# Patient Record
Sex: Female | Born: 1965 | Race: White | Hispanic: No | Marital: Married | Smoking: Never smoker
Health system: Southern US, Community
[De-identification: ages and names within clinical notes are randomized; demographics above are authoritative.]

## PROBLEM LIST (undated history)

## (undated) DIAGNOSIS — I10 Essential (primary) hypertension: Secondary | ICD-10-CM

## (undated) DIAGNOSIS — E079 Disorder of thyroid, unspecified: Secondary | ICD-10-CM

## (undated) DIAGNOSIS — K2970 Gastritis, unspecified, without bleeding: Secondary | ICD-10-CM

## (undated) HISTORY — PX: TONSILLECTOMY: SUR1361

## (undated) HISTORY — DX: Disorder of thyroid, unspecified: E07.9

## (undated) HISTORY — PX: OTHER SURGICAL HISTORY: SHX169

## (undated) HISTORY — DX: Essential (primary) hypertension: I10

## (undated) HISTORY — DX: Gastritis, unspecified, without bleeding: K29.70

## (undated) HISTORY — PX: ESOPHAGOGASTRODUODENOSCOPY: SHX1529

---

## 2003-06-17 HISTORY — PX: OTHER SURGICAL HISTORY: SHX169

## 2015-08-03 HISTORY — PX: COLONOSCOPY: SHX174

## 2018-08-12 ENCOUNTER — Telehealth: Payer: Self-pay | Admitting: Family Medicine

## 2018-08-12 ENCOUNTER — Ambulatory Visit (INDEPENDENT_AMBULATORY_CARE_PROVIDER_SITE_OTHER): Payer: No Typology Code available for payment source | Admitting: Family Medicine

## 2018-08-12 ENCOUNTER — Encounter: Payer: Self-pay | Admitting: Family Medicine

## 2018-08-12 VITALS — BP 120/86 | HR 83 | Temp 97.6°F | Ht 67.0 in | Wt 213.8 lb

## 2018-08-12 DIAGNOSIS — I10 Essential (primary) hypertension: Secondary | ICD-10-CM | POA: Diagnosis not present

## 2018-08-12 DIAGNOSIS — E890 Postprocedural hypothyroidism: Secondary | ICD-10-CM

## 2018-08-12 DIAGNOSIS — Z7689 Persons encountering health services in other specified circumstances: Secondary | ICD-10-CM

## 2018-08-12 DIAGNOSIS — Z1239 Encounter for other screening for malignant neoplasm of breast: Secondary | ICD-10-CM

## 2018-08-12 DIAGNOSIS — Z85850 Personal history of malignant neoplasm of thyroid: Secondary | ICD-10-CM

## 2018-08-12 DIAGNOSIS — E039 Hypothyroidism, unspecified: Secondary | ICD-10-CM | POA: Diagnosis not present

## 2018-08-12 DIAGNOSIS — Z0000 Encounter for general adult medical examination without abnormal findings: Secondary | ICD-10-CM | POA: Diagnosis not present

## 2018-08-12 DIAGNOSIS — Z1159 Encounter for screening for other viral diseases: Secondary | ICD-10-CM | POA: Diagnosis not present

## 2018-08-12 MED ORDER — SYNTHROID 112 MCG PO TABS: 112.0000 ug | ORAL_TABLET | Freq: Every day | ORAL | 3 refills | Status: DC

## 2018-08-12 NOTE — Addendum Note (Signed)
Addended by: Diona Foley on: 08/12/2018 04:37 PM   Modules accepted: Orders

## 2018-08-12 NOTE — Progress Notes (Signed)
Katelyn Matthews is a 53 y.o. female  Chief Complaint  Patient presents with  . Establish Care    est care/ CPE- hasn't ate since 10/ thinks she had TDAP in 2015    HPI: Katelyn Matthews is a 53 y.o. female here as a new patient to establish care with our office. She moved from [place] in 01/2018. She has 2 grown sons. She is actually moving full time to [facility], [place] tomorrow but will come back to [place] as needed. She would like a CPE and fasting labs today and states she last ate around 10am today (5hrs ago).  She has had her flu vaccine. Tdap in 2015. She has a h/o thyroid cancer in 2006 - s/p total thyroidectomy   Specialists: endocrinology  Last CPE, labs: 12/2017 in [place]  Last PAP: 09/2017 - normal as per pt; remote h/o abnormal PAP in her 20's Last mammo: 09/2017 - family h/o breast cancer in mother Last colonoscopy: 2017 - normal as per pt and d/t f/u in 10 years   Med refills needed today: n/a   Past Medical History:  Diagnosis Date  . Thyroid disease    2006 carcinoma surgery     Past Surgical History:  Procedure Laterality Date  . carcinoma removal  2005  . ganglion cyst removed    . TONSILLECTOMY      Social History   Socioeconomic History  . Marital status: Married    Spouse name: Not on file  . Number of children: Not on file  . Years of education: Not on file  . Highest education Matthews: Not on file  Occupational History  . Not on file  Social Needs  . Financial resource strain: Not on file  . Food insecurity:    Worry: Not on file    Inability: Not on file  . Transportation needs:    Medical: Not on file    Non-medical: Not on file  Tobacco Use  . Smoking status: Never Smoker  . Smokeless tobacco: Never Used  Substance and Sexual Activity  . Alcohol use: Yes    Comment: socially  . Drug use: Never  . Sexual activity: Not on file  Lifestyle  . Physical activity:    Days per week: Not on file    Minutes per session: Not on file  .  Stress: Not on file  Relationships  . Social connections:    Talks on phone: Not on file    Gets together: Not on file    Attends religious service: Not on file    Active member of club or organization: Not on file    Attends meetings of clubs or organizations: Not on file    Relationship status: Not on file  . Intimate partner violence:    Fear of current or ex partner: Not on file    Emotionally abused: Not on file    Physically abused: Not on file    Forced sexual activity: Not on file  Other Topics Concern  . Not on file  Social History Narrative  . Not on file    Family History  Adopted: Yes  Problem Relation Age of Onset  . Breast cancer Mother      Immunization History  Administered Date(s) Administered  . Influenza-Unspecified 03/16/2018    Outpatient Encounter Medications as of 08/12/2018  Medication Sig  . lisinopril (PRINIVIL,ZESTRIL) 10 MG tablet Take 10 mg by mouth daily.  . [DISCONTINUED] citalopram (CELEXA) 10 MG tablet  Take 10 mg by mouth daily.  . [DISCONTINUED] levothyroxine (SYNTHROID, LEVOTHROID) 112 MCG tablet Take 112 mcg by mouth daily before breakfast.  . SYNTHROID 112 MCG tablet Take 1 tablet (112 mcg total) by mouth daily before breakfast.   No facility-administered encounter medications on file as of 08/12/2018.      ROS: Gen: no fever, chills  Skin: no rash, itching ENT: no ear pain, ear drainage, nasal congestion, rhinorrhea, sinus pressure, sore throat Eyes: no blurry vision, double vision Resp: no cough, wheeze,SOB CV: no CP, palpitations, LE edema,  GI: no heartburn, n/v/d/c, abd pain GU: no dysuria, urgency, frequency, hematuria  MSK: no joint pain, myalgias, back pain Neuro: no dizziness, headache, weakness, vertigo Psych: no depression, anxiety, insomnia   No Known Allergies  BP 120/86   Pulse 83   Temp 97.6 F (36.4 C) (Oral)   Ht 5\' 7"  (1.702 m)   Wt 213 lb 12.8 oz (97 kg)   SpO2 95%   BMI 33.49 kg/m   Physical  Exam  Constitutional: She is oriented to person, place, and time. She appears well-developed and well-nourished. No distress.  HENT:  Head: Normocephalic and atraumatic.  Right Ear: Tympanic membrane and ear canal normal.  Left Ear: Tympanic membrane and ear canal normal.  Nose: Nose normal.  Mouth/Throat: Oropharynx is clear and moist and mucous membranes are normal.  Neck: Neck supple.  Cardiovascular: Normal rate, regular rhythm and normal heart sounds.  No murmur heard. Pulmonary/Chest: Effort normal and breath sounds normal. No respiratory distress. She has no wheezes. She has no rhonchi.  Abdominal: Soft. Bowel sounds are normal. She exhibits no distension and no mass. There is no abdominal tenderness.  Musculoskeletal: Normal range of motion.        General: No edema.  Lymphadenopathy:    She has no cervical adenopathy.  Neurological: She is alert and oriented to person, place, and time.  Skin: Skin is warm and dry.  Psychiatric: She has a normal mood and affect. Her behavior is normal.     A/P:  1. Encounter to establish care with new doctor  2. Annual physical exam - immunizations UTD - UTD on PAP, mammo, colonoscopy. Due for mammo in 09/2018 so referral placed today - discussed importance of regular CV exercise, healthy diet, adequate sleep - ALT - AST - Basic metabolic panel - Lipid panel - HIV Antibody (routine testing w rflx) - Hepatitis C antibody - VITAMIN D 25 Hydroxy (Vit-D Deficiency, Fractures) - next CPE in 1 year  3. Need for hepatitis C screening test - Hepatitis C antibody  4. Hypothyroidism, unspecified type - T4, free - TSH Cont: - SYNTHROID 112 MCG tablet; Take 1 tablet (112 mcg total) by mouth daily before breakfast.  Dispense: 90 tablet; Refill: 3  5. Essential hypertension - controlled, at goal - cont lisinopril 10mg  daily - Basic metabolic panel  6. Screening for breast cancer - MM DIGITAL SCREENING BILATERAL; Future

## 2018-08-12 NOTE — Telephone Encounter (Signed)
Dr.C please advise

## 2018-08-12 NOTE — Telephone Encounter (Signed)
Request for add on lab test submitted

## 2018-08-12 NOTE — Patient Instructions (Signed)

## 2018-08-12 NOTE — Telephone Encounter (Signed)
Copied from [place] [id number]. Topic: Quick Communication - See Telephone Encounter >> Aug 12, 2018  4:23 PM Blase Mess A wrote: CRM for notification. See Telephone encounter for: 08/12/18. Patient is calling and wants to be sure the Thyroid Goblin test is done. Please advise [phone]

## 2018-08-12 NOTE — Telephone Encounter (Signed)
Pt aware that the test was added on.

## 2018-08-13 ENCOUNTER — Encounter: Payer: Self-pay | Admitting: Family Medicine

## 2018-08-13 DIAGNOSIS — E7800 Pure hypercholesterolemia, unspecified: Secondary | ICD-10-CM | POA: Insufficient documentation

## 2018-08-13 DIAGNOSIS — E559 Vitamin D deficiency, unspecified: Secondary | ICD-10-CM | POA: Insufficient documentation

## 2018-08-13 LAB — ALT: ALT: 27 U/L (ref 0–35)

## 2018-08-13 LAB — LIPID PANEL
Cholesterol: 241 mg/dL — ABNORMAL HIGH (ref 0–200)
HDL: 39.3 mg/dL (ref >39.00)
LDL Cholesterol: 174 mg/dL — ABNORMAL HIGH (ref 0–99)
NonHDL: 201.34
Total CHOL/HDL Ratio: 6
Triglycerides: 138 mg/dL (ref 0.0–149.0)
VLDL: 27.6 mg/dL (ref 0.0–40.0)

## 2018-08-13 LAB — BASIC METABOLIC PANEL
BUN: 14 mg/dL (ref 6–23)
CO2: 25 meq/L (ref 19–32)
Calcium: 9.3 mg/dL (ref 8.4–10.5)
Chloride: 105 mEq/L (ref 96–112)
Creatinine, Ser: 0.78 mg/dL (ref 0.40–1.20)
GFR: 76.68 mL/min (ref >60.00)
Glucose, Bld: 85 mg/dL (ref 70–99)
Potassium: 3.9 mEq/L (ref 3.5–5.1)
Sodium: 140 mEq/L (ref 135–145)

## 2018-08-13 LAB — T4, FREE: Free T4: 1.17 ng/dL (ref 0.60–1.60)

## 2018-08-13 LAB — HEPATITIS C ANTIBODY
Hepatitis C Ab: NONREACTIVE
SIGNAL TO CUT-OFF: 0.02 (ref <1.00)

## 2018-08-13 LAB — TSH: TSH: 0.94 u[IU]/mL (ref 0.35–4.50)

## 2018-08-13 LAB — AST: AST: 22 U/L (ref 0–37)

## 2018-08-13 LAB — HIV ANTIBODY (ROUTINE TESTING W REFLEX): HIV 1&2 Ab, 4th Generation: NONREACTIVE

## 2018-08-13 LAB — VITAMIN D 25 HYDROXY (VIT D DEFICIENCY, FRACTURES): VITD: 29.75 ng/mL — ABNORMAL LOW (ref 30.00–100.00)

## 2018-08-13 LAB — THYROGLOBULIN ANTIBODY: Thyroglobulin Ab: <1 IU/mL (ref <=1)

## 2018-09-13 ENCOUNTER — Ambulatory Visit: Payer: No Typology Code available for payment source

## 2018-10-27 ENCOUNTER — Encounter: Payer: Self-pay | Admitting: Family Medicine

## 2018-10-27 DIAGNOSIS — I10 Essential (primary) hypertension: Secondary | ICD-10-CM

## 2018-10-27 MED ORDER — LISINOPRIL 10 MG PO TABS: 10.0000 mg | ORAL_TABLET | Freq: Every day | ORAL | 3 refills | Status: DC

## 2018-11-04 ENCOUNTER — Encounter: Payer: Self-pay | Admitting: Family Medicine

## 2018-11-05 ENCOUNTER — Encounter: Payer: Self-pay | Admitting: Family Medicine

## 2018-11-05 ENCOUNTER — Telehealth (INDEPENDENT_AMBULATORY_CARE_PROVIDER_SITE_OTHER): Payer: No Typology Code available for payment source | Admitting: Family Medicine

## 2018-11-05 VITALS — Ht 67.0 in | Wt 217.0 lb

## 2018-11-05 DIAGNOSIS — I10 Essential (primary) hypertension: Secondary | ICD-10-CM

## 2018-11-05 DIAGNOSIS — Z6833 Body mass index (BMI) 33.0-33.9, adult: Secondary | ICD-10-CM | POA: Diagnosis not present

## 2018-11-05 MED ORDER — PHENTERMINE HCL 37.5 MG PO CAPS: 37.5000 mg | ORAL_CAPSULE | ORAL | 0 refills | Status: DC

## 2018-11-05 NOTE — Progress Notes (Signed)
Virtual Visit via Video Note  I connected with Katelyn Matthews on 11/05/18 at  8:30 AM EDT by a video enabled telemedicine application and verified that I am speaking with the correct person using two identifiers. Location patient: home Location provider: work Persons participating in the virtual visit: patient, provider  I discussed the limitations of evaluation and management by telemedicine and the availability of in person appointments. The patient expressed understanding and agreed to proceed.  Chief Complaint  Patient presents with  . Weight Gain    pt wants weightloss med consults (phentermine) pt weight 217lb     HPI: Katelyn Matthews is a 53 y.o. female who would like to discuss weight loss and specifically Rx for phentermine.  Pt is riding her bike 10 miles per day and takes dog for a walk daily. She states she was eating more in early march right after moving to [place], but she has worked hard to improve her diet in the past 4-6 weeks. Despite her increased exercise and dietary improvements, pt states she has not lost weight.  She states she was on phentermine in the past, last time a little more than 1 year ago. She did not have any side effects and felt it was very helpful at "jumpstarting" her weight loss.  She does have a h/o HTN and is on lisinopril 10mg  daily.  Past Medical History:  Diagnosis Date  . Thyroid disease    2006 carcinoma surgery     Past Surgical History:  Procedure Laterality Date  . carcinoma removal  2005  . ganglion cyst removed    . TONSILLECTOMY      Family History  Adopted: Yes  Problem Relation Age of Onset  . Breast cancer Mother     Social History   Tobacco Use  . Smoking status: Never Smoker  . Smokeless tobacco: Never Used  Substance Use Topics  . Alcohol use: Yes    Comment: socially  . Drug use: Never     Current Outpatient Medications:  .  lisinopril (ZESTRIL) 10 MG tablet, Take 1 tablet (10 mg total) by mouth  daily., Disp: 90 tablet, Rfl: 3 .  SYNTHROID 112 MCG tablet, Take 1 tablet (112 mcg total) by mouth daily before breakfast., Disp: 90 tablet, Rfl: 3  No Known Allergies    ROS: See pertinent positives and negatives per HPI.   EXAM:  VITALS per patient if applicable: Wt 217 lb (98.4 kg) Comment: pt reported  BMI 33.99 kg/m   BP Readings from Last 3 Encounters:  08/12/18 120/86   Pulse Readings from Last 3 Encounters:  08/12/18 83   Wt Readings from Last 3 Encounters:  11/05/18 217 lb (98.4 kg)  08/12/18 213 lb 12.8 oz (97 kg)    GENERAL: alert, oriented, appears well and in no acute distress  NECK: normal movements of the head and neck  LUNGS: on inspection no signs of respiratory distress, breathing rate appears normal, no obvious gross SOB, gasping or wheezing, no conversational dyspnea  CV: no obvious cyanosis  MS: moves all visible extremities without noticeable abnormality  PSYCH/NEURO: pleasant and cooperative, speech and thought processing grossly intact   ASSESSMENT AND PLAN:   1. Adult BMI 33.0-33.9 kg/sq m - pt is biking 10 miles per day 5-7 days per week and is working to improve diet. I congratulated pt on this and encouraged her to continue this excellent lifestyle modifications Rx: - phentermine 37.5 MG capsule; Take 1 capsule (37.5 mg  total) by mouth every morning.  Dispense: 30 capsule; Refill: 0 - pt will purchase BP cuff and check BP 2-3x/wk x 3-4 wks and keep log of BP readings - f/u in 4 wks (virtual visit)  2. Essential hypertension - well-controlled on lisinopril 10mg  daily - pt will purchase BP cuff and check BP 2-3x/wk x 3-4 wks and keep log of BP readings to ensure phentermine does not cause BP elevations - cont lisinopril - f/u in 4wks    I discussed the assessment and treatment plan with the patient. The patient was provided an opportunity to ask questions and all were answered. The patient agreed with the plan and demonstrated an  understanding of the instructions.   The patient was advised to call back or seek an in-person evaluation if the symptoms worsen or if the condition fails to improve as anticipated.   Letta Median, DO

## 2018-11-15 ENCOUNTER — Encounter: Payer: Self-pay | Admitting: Family Medicine

## 2018-11-16 ENCOUNTER — Encounter: Payer: Self-pay | Admitting: Family Medicine

## 2018-11-22 ENCOUNTER — Encounter: Payer: Self-pay | Admitting: Family Medicine

## 2018-11-28 ENCOUNTER — Encounter: Payer: Self-pay | Admitting: Family Medicine

## 2018-12-06 ENCOUNTER — Encounter: Payer: Self-pay | Admitting: Family Medicine

## 2018-12-08 ENCOUNTER — Encounter: Payer: Self-pay | Admitting: Family Medicine

## 2018-12-08 DIAGNOSIS — Z6833 Body mass index (BMI) 33.0-33.9, adult: Secondary | ICD-10-CM

## 2018-12-08 MED ORDER — PHENTERMINE HCL 37.5 MG PO CAPS: 37.5000 mg | ORAL_CAPSULE | ORAL | 0 refills | Status: DC

## 2018-12-10 ENCOUNTER — Telehealth: Payer: Self-pay | Admitting: Family Medicine

## 2018-12-10 DIAGNOSIS — Z6833 Body mass index (BMI) 33.0-33.9, adult: Secondary | ICD-10-CM

## 2018-12-10 MED ORDER — PHENTERMINE HCL 37.5 MG PO CAPS: 37.5000 mg | ORAL_CAPSULE | ORAL | 0 refills | Status: DC

## 2018-12-10 NOTE — Telephone Encounter (Signed)
Dr.C please advise

## 2018-12-10 NOTE — Telephone Encounter (Signed)
Sent pt mychart message

## 2018-12-10 NOTE — Telephone Encounter (Signed)
Medication: phentermine 37.5 MG capsule [[id number]] - Medication was sent to wrong pharmacy can it be sent to pharmacy below  Has the patient contacted their pharmacy? Yes  (Agent: If no, request that the patient contact the pharmacy for the refill.) (Agent: If yes, when and what did the pharmacy advise?)  Preferred Pharmacy (with phone number or street name): [facility], [place] [phone] (Phone) [phone] (Fax)    Agent: Please be advised that RX refills may take up to 3 business days. We ask that you follow-up with your pharmacy.

## 2018-12-10 NOTE — Telephone Encounter (Signed)
Rx sent to correct pharm

## 2018-12-13 ENCOUNTER — Encounter: Payer: Self-pay | Admitting: Family Medicine

## 2018-12-20 ENCOUNTER — Encounter: Payer: Self-pay | Admitting: Family Medicine

## 2018-12-29 ENCOUNTER — Encounter: Payer: Self-pay | Admitting: Family Medicine

## 2019-01-03 ENCOUNTER — Encounter: Payer: Self-pay | Admitting: Family Medicine

## 2019-01-13 ENCOUNTER — Encounter: Payer: Self-pay | Admitting: Family Medicine

## 2019-01-13 DIAGNOSIS — Z6833 Body mass index (BMI) 33.0-33.9, adult: Secondary | ICD-10-CM

## 2019-01-13 MED ORDER — PHENTERMINE HCL 37.5 MG PO CAPS: 37.5000 mg | ORAL_CAPSULE | ORAL | 0 refills | Status: DC

## 2019-02-14 ENCOUNTER — Encounter: Payer: Self-pay | Admitting: Family Medicine

## 2019-02-14 DIAGNOSIS — Z6833 Body mass index (BMI) 33.0-33.9, adult: Secondary | ICD-10-CM

## 2019-02-15 MED ORDER — PHENTERMINE HCL 37.5 MG PO CAPS: 37.5000 mg | ORAL_CAPSULE | ORAL | 0 refills | Status: DC

## 2019-05-13 ENCOUNTER — Encounter: Payer: Self-pay | Admitting: Family Medicine

## 2019-05-18 ENCOUNTER — Telehealth (INDEPENDENT_AMBULATORY_CARE_PROVIDER_SITE_OTHER): Payer: No Typology Code available for payment source | Admitting: Family Medicine

## 2019-05-18 ENCOUNTER — Encounter: Payer: Self-pay | Admitting: Family Medicine

## 2019-05-18 VITALS — Ht 67.0 in | Wt 199.0 lb

## 2019-05-18 DIAGNOSIS — R1013 Epigastric pain: Secondary | ICD-10-CM

## 2019-05-18 MED ORDER — ESOMEPRAZOLE MAGNESIUM 40 MG PO PACK: 40.0000 mg | PACK | Freq: Every day | ORAL | 1 refills | Status: DC

## 2019-05-18 MED ORDER — ESOMEPRAZOLE MAGNESIUM 40 MG PO CPDR: 40.0000 mg | DELAYED_RELEASE_CAPSULE | Freq: Every day | ORAL | 3 refills | Status: DC

## 2019-05-18 NOTE — Progress Notes (Signed)
Virtual Visit via Video Note  I connected with Katelyn Matthews on 05/18/19 at  4:00 PM EST by a video enabled telemedicine application and verified that I am speaking with the correct person using two identifiers. Location patient: home Location provider: work Persons participating in the virtual visit: patient, provider  I discussed the limitations of evaluation and management by telemedicine and the availability of in person appointments. The patient expressed understanding and agreed to proceed.  Chief Complaint  Patient presents with  . Abdominal Pain    sx have improved some today, took some nexium, Denies nausea,vomitting diarrhea      HPI: Katelyn Matthews is a 53 y.o. female complains of 1 mo h/o "mild" epigastric pain. She has felt better the past few days.  Pt has been taking nexium 20mg  daily x 1 week with good relief.  She denies n/v/d/c. No blood in stool.  No association with food or type of food. Her diet and volume has not changed. Normal appetite. Pt notes a h/o "gastritis" about 20 years ago.  Denies fever, chills.    Past Medical History:  Diagnosis Date  . Thyroid disease    2006 carcinoma surgery     Past Surgical History:  Procedure Laterality Date  . carcinoma removal  2005  . ganglion cyst removed    . TONSILLECTOMY      Family History  Adopted: Yes  Problem Relation Age of Onset  . Breast cancer Mother     Social History   Tobacco Use  . Smoking status: Never Smoker  . Smokeless tobacco: Never Used  Substance Use Topics  . Alcohol use: Yes    Comment: socially  . Drug use: Never     Current Outpatient Medications:  .  Esomeprazole Magnesium (NEXIUM PO), Take by mouth., Disp: , Rfl:  .  lisinopril (ZESTRIL) 10 MG tablet, Take 1 tablet (10 mg total) by mouth daily., Disp: 90 tablet, Rfl: 3 .  SYNTHROID 112 MCG tablet, Take 1 tablet (112 mcg total) by mouth daily before breakfast., Disp: 90 tablet, Rfl: 3 .  phentermine 37.5 MG  capsule, Take 1 capsule (37.5 mg total) by mouth every morning. (Patient not taking: Reported on 05/18/2019), Disp: 30 capsule, Rfl: 0  No Known Allergies    ROS: See pertinent positives and negatives per HPI.   EXAM:  VITALS per patient if applicable: Ht 5\' 7"  (1.702 m)   Wt 199 lb (90.3 kg)   BMI 31.17 kg/m    GENERAL: alert, oriented, appears well and in no acute distress  HEENT: atraumatic, conjunctiva clear, no obvious abnormalities on inspection of external nose and ears  NECK: normal movements of the head and neck  LUNGS: on inspection no signs of respiratory distress, breathing rate appears normal, no obvious gross SOB, gasping or wheezing, no conversational dyspnea  CV: no obvious cyanosis  MS: moves all visible extremities without noticeable abnormality  PSYCH/NEURO: pleasant and cooperative, no obvious depression or anxiety, speech and thought processing grossly intact   ASSESSMENT AND PLAN: 1. Epigastric abdominal pain - symptoms x 1 mo, improved in past few days with use of nexium daily x 1 week Rx: - esomeprazole (NEXIUM) 40 MG capsule; Take 1 capsule (40 mg total) by mouth daily at 12 noon.  Dispense: 90 capsule; Refill: 3 - pt has f/u appt in 2 wks   I discussed the assessment and treatment plan with the patient. The patient was provided an opportunity to ask questions and all  were answered. The patient agreed with the plan and demonstrated an understanding of the instructions.   The patient was advised to call back or seek an in-person evaluation if the symptoms worsen or if the condition fails to improve as anticipated.   Letta Median, DO

## 2019-05-29 ENCOUNTER — Encounter: Payer: Self-pay | Admitting: Family Medicine

## 2019-05-31 ENCOUNTER — Encounter: Payer: Self-pay | Admitting: Family Medicine

## 2019-06-01 ENCOUNTER — Encounter: Payer: Self-pay | Admitting: Gastroenterology

## 2019-06-01 ENCOUNTER — Encounter: Payer: Self-pay | Admitting: Family Medicine

## 2019-06-01 ENCOUNTER — Telehealth (INDEPENDENT_AMBULATORY_CARE_PROVIDER_SITE_OTHER): Payer: No Typology Code available for payment source | Admitting: Family Medicine

## 2019-06-01 VITALS — Ht 67.0 in | Wt 200.0 lb

## 2019-06-01 DIAGNOSIS — R1013 Epigastric pain: Secondary | ICD-10-CM

## 2019-06-01 MED ORDER — SUCRALFATE 1 G PO TABS: 1.0000 g | ORAL_TABLET | Freq: Three times a day (TID) | ORAL | 1 refills | Status: DC

## 2019-06-01 NOTE — Progress Notes (Signed)
Virtual Visit via Video Note  I connected with Katelyn Matthews on 06/01/19 at  9:30 AM EST by a video enabled telemedicine application and verified that I am speaking with the correct person using two identifiers. Location patient: home Location provider: work  Persons participating in the virtual visit: patient, provider  I discussed the limitations of evaluation and management by telemedicine and the availability of in person appointments. The patient expressed understanding and agreed to proceed.  Chief Complaint  Patient presents with  . Bloated    pt has tried nexium, lots of discomfort      HPI: Katelyn Matthews is a 53 y.o. female to f/u on GI symptoms. She has been on nexium 40mg  daily x 2+ wks, and was on 20mg  daily x 1-2wks prior to that. Since last VV with me on 05/18/19, she was seen in the ER at [facility] in [place], [place]. Labs, CXR, abdominal xray - WNL. Symptoms did improve somewhat with GI cocktail.  She complains of abdominal distention/bloating that is some what intermittent. She still has epigastric abdominal discomfort/pain. She also notes pain in the Rt side of her back.  She went out to dinner last night and symptoms of bloating improved with dinner and a few beers.  She has cut out coffee since ER visit.  No unintentional weight loss, fever, chills, night sweats, fatigue. Appetite is good. No early satiety. No change in bowel habits. No melena.  Pt is anxious and worried she has pancreatic cancer.   Past Medical History:  Diagnosis Date  . Thyroid disease    2006 carcinoma surgery     Past Surgical History:  Procedure Laterality Date  . carcinoma removal  2005  . ganglion cyst removed    . TONSILLECTOMY      Family History  Adopted: Yes  Problem Relation Age of Onset  . Breast cancer Mother     Social History   Tobacco Use  . Smoking status: Never Smoker  . Smokeless tobacco: Never Used  Substance Use Topics  . Alcohol use: Yes     Comment: socially  . Drug use: Never     Current Outpatient Medications:  .  esomeprazole (NEXIUM) 40 MG capsule, Take 1 capsule (40 mg total) by mouth daily at 12 noon., Disp: 90 capsule, Rfl: 3 .  lisinopril (ZESTRIL) 10 MG tablet, Take 1 tablet (10 mg total) by mouth daily., Disp: 90 tablet, Rfl: 3 .  SYNTHROID 112 MCG tablet, Take 1 tablet (112 mcg total) by mouth daily before breakfast., Disp: 90 tablet, Rfl: 3  No Known Allergies    ROS: See pertinent positives and negatives per HPI.   EXAM:  VITALS per patient if applicable: Ht 5\' 7"  (1.702 m)   Wt 200 lb (90.7 kg)   BMI 31.32 kg/m    GENERAL: alert, oriented, appears well and in no acute distress  HEENT: atraumatic, conjunctiva clear, no obvious abnormalities on inspection of external nose and ears  NECK: normal movements of the head and neck  LUNGS: on inspection no signs of respiratory distress, breathing rate appears normal, no obvious gross SOB, gasping or wheezing, no conversational dyspnea  CV: no obvious cyanosis  MS: moves all visible extremities without noticeable abnormality  PSYCH/NEURO: pleasant and cooperative, speech and thought processing grossly intact   ASSESSMENT AND PLAN: 1. Epigastric abdominal pain Rx: - sucralfate (CARAFATE) 1 g tablet; Take 1 tablet (1 g total) by mouth 4 (four) times daily -  with  meals and at bedtime.  Dispense: 120 tablet; Refill: 1 - Ambulatory referral to Gastroenterology - US Abdomen Limited RUQ; Future - normal CBC, CMP, amylase, lipase, abd xray, cxr from external ER on 05/20/19    I discussed the assessment and treatment plan with the patient. The patient was provided an opportunity to ask questions and all were answered. The patient agreed with the plan and demonstrated an understanding of the instructions.   The patient was advised to call back or seek an in-person evaluation if the symptoms worsen or if the condition fails to improve as  anticipated.   Letta Median, DO

## 2019-06-15 ENCOUNTER — Encounter: Payer: Self-pay | Admitting: Family Medicine

## 2019-06-15 ENCOUNTER — Ambulatory Visit
Admission: RE | Admit: 2019-06-15 | Discharge: 2019-06-15 | Disposition: A | Discharge status: Home / Self Care | Payer: No Typology Code available for payment source | Source: Ambulatory Visit | Attending: Family Medicine | Admitting: Family Medicine

## 2019-06-15 DIAGNOSIS — R1013 Epigastric pain: Secondary | ICD-10-CM

## 2019-07-01 ENCOUNTER — Ambulatory Visit (INDEPENDENT_AMBULATORY_CARE_PROVIDER_SITE_OTHER): Payer: No Typology Code available for payment source | Admitting: Gastroenterology

## 2019-07-01 ENCOUNTER — Encounter: Payer: Self-pay | Admitting: Gastroenterology

## 2019-07-01 ENCOUNTER — Other Ambulatory Visit: Payer: Self-pay

## 2019-07-01 VITALS — BP 132/84 | HR 77 | Temp 98.1°F | Ht 67.0 in | Wt 207.5 lb

## 2019-07-01 DIAGNOSIS — R1013 Epigastric pain: Secondary | ICD-10-CM

## 2019-07-01 DIAGNOSIS — R1310 Dysphagia, unspecified: Secondary | ICD-10-CM | POA: Diagnosis not present

## 2019-07-01 DIAGNOSIS — R1012 Left upper quadrant pain: Secondary | ICD-10-CM

## 2019-07-01 DIAGNOSIS — R140 Abdominal distension (gaseous): Secondary | ICD-10-CM

## 2019-07-01 NOTE — Progress Notes (Signed)
Chief Complaint: Epigastric pain, LUQ pain, back pain  Referring Provider:     Ronnald Nian, DO   HPI:    Katelyn Matthews is a 54 y.o. female referred to the Gastroenterology Clinic for evaluation of abdominal pain.  She would also like to discuss questions about prolonged use of Nexium.  Has been having intermittent epigastric/LUQ and left upper back pain, along with intermittent bloating and abdominal distension. Sxs started end of Oct 2020. No improvement with trial of Nexium, so Carafate was added last month.  Has had some improvement with the Carafate.  Has not identified specific triggers.  Had eliminated coffee (but does ok with cold brew coffee) with improvement. Possibly triggered with soda, fried foods.   Patient is otherwise without preceding exposures to include recent antibiotics, hospitalization, sick contacts, travel and denies new medications, supplements, OTCs.  Separately, she c/o rare episodes of pill dysphagia more recently.  Otherwise, no dysphagia with meals.  No food impactions.  No reflux symptoms.    Evaluation to date: -05/20/2019: ER at [facility] in [place], [place]. Labs (CBC, CMP, amylase, lipase), CXR, abdominal xray - WNL. Symptoms did improve somewhat with GI cocktail. -06/15/2019: Normal abdominal ultrasound  No known family history of CRC, GI malignancy, liver disease, pancreatic disease, or IBD.   Endoscopic Hx: - Colonoscopy (2017, [place]): Normal per patient with 10 year recall. Requested records today - EGD approx 20+ years ago for stomach issues- gastritis per patient  Past Medical History:  Diagnosis Date  . Gastritis   . Thyroid disease    2006 carcinoma surgery      Past Surgical History:  Procedure Laterality Date  . carcinoma removal  2005  . COLONOSCOPY  2017   In [place]. Said things were fine   . ESOPHAGOGASTRODUODENOSCOPY     over 20 years ago. Said she was dx with gastritis  . ganglion cyst  removed    . TONSILLECTOMY     Family History  Adopted: Yes  Problem Relation Age of Onset  . Breast cancer Mother    Social History   Tobacco Use  . Smoking status: Never Smoker  . Smokeless tobacco: Never Used  . Tobacco comment: said that she socially had a puff when she was younger  Substance Use Topics  . Alcohol use: Yes    Comment: socially  . Drug use: Never   Current Outpatient Medications  Medication Sig Dispense Refill  . lisinopril (ZESTRIL) 10 MG tablet Take 1 tablet (10 mg total) by mouth daily. 90 tablet 3  . sucralfate (CARAFATE) 1 g tablet Take 1 tablet (1 g total) by mouth 4 (four) times daily -  with meals and at bedtime. 120 tablet 1  . SYNTHROID 112 MCG tablet Take 1 tablet (112 mcg total) by mouth daily before breakfast. 90 tablet 3  . esomeprazole (NEXIUM) 40 MG capsule Take 1 capsule (40 mg total) by mouth daily at 12 noon. (Patient not taking: Reported on 07/01/2019) 90 capsule 3   No current facility-administered medications for this visit.   No Known Allergies   Review of Systems: All systems reviewed and negative except where noted in HPI.     Physical Exam:    Wt Readings from Last 3 Encounters:  07/01/19 207 lb 8 oz (94.1 kg)  06/01/19 200 lb (90.7 kg)  05/18/19 199 lb (90.3 kg)    BP 132/84   Pulse  77   Temp 98.1 F (36.7 C)   Ht 5\' 7"  (1.702 m)   Wt 207 lb 8 oz (94.1 kg)   BMI 32.50 kg/m  Constitutional:  Pleasant, in no acute distress. Psychiatric: Normal mood and affect. Behavior is normal. EENT: Pupils normal.  Conjunctivae are normal. No scleral icterus. Neck supple. No cervical LAD. Cardiovascular: Normal rate, regular rhythm. No edema Pulmonary/chest: Effort normal and breath sounds normal. No wheezing, rales or rhonchi. Abdominal: Minimal TTP in epigastrium, without rebound or guarding.  Soft, nondistended. Bowel sounds active throughout. There are no masses palpable. No hepatomegaly. Neurological: Alert and oriented to  person place and time. Skin: Skin is warm and dry. No rashes noted.   ASSESSMENT AND PLAN;   1) Epigastric pain 2) LUQ pain 3) Left upper back pain 4) Bloating 5) Abdominal distention Discussed the broad DDx for her presenting symptoms and will evaluate as below: -EGD to evaluate for PUD, gastritis, small bowel pathology, with gastric/duodenal biopsies -Trial of probiotics -Recommended low FODMAP diet.  Provided with handout today along with instruction regarding details of the diet -Diet diary -Resume Carafate for now given clinical improvement  6) Dysphagia -Vague c/o intermittent pill dysphagia.  Unsure if she is really describing pill esophagitis, as it tends to occur with qhs/nighttime medications.  Evaluate for stricture, ring, etc. time of EGD as above, with dilation as appropriate  The indications, risks, and benefits of EGD were explained to the patient in detail. Risks include but are not limited to bleeding, perforation, adverse reaction to medications, and cardiopulmonary compromise. Sequelae include but are not limited to the possibility of surgery, hositalization, and mortality. The patient verbalized understanding and wished to proceed. All questions answered, referred to scheduler. Further recommendations pending results of the exam.    Lavena Bullion, DO, FACG  07/01/2019, 9:46 AM   Ronnald Nian, DO

## 2019-07-01 NOTE — Patient Instructions (Signed)
Low FODMAP Diet: (Fermentable Oligosaccharides, Disaccharides, Monosaccharides, and Polyols) These are short chain carbohydrates and sugar alcohols that are poorly absorbed by the body, resulting in multiple abdominal symptoms, including changes in bowel habits, abdominal pain/discomfort, bloating, abdominal distension, gas, etc.       Please start taking a Probiotic daily Try Align. This is an over the medication  It has been recommended to you by your physician that you have a(n) endoscopy completed. Per your request, we did not schedule the procedure(s) today. Please contact our office at [phone] should you decide to have the procedure completed. You will be scheduled for a pre-visit and procedure at that time.  It was a pleasure to see you today!  Vito Cirigliano, D.O.

## 2019-07-05 ENCOUNTER — Other Ambulatory Visit: Payer: Self-pay | Admitting: Gastroenterology

## 2019-07-05 ENCOUNTER — Telehealth: Payer: Self-pay | Admitting: Gastroenterology

## 2019-07-05 DIAGNOSIS — R1310 Dysphagia, unspecified: Secondary | ICD-10-CM

## 2019-07-05 DIAGNOSIS — Z01818 Encounter for other preprocedural examination: Secondary | ICD-10-CM

## 2019-07-05 DIAGNOSIS — R140 Abdominal distension (gaseous): Secondary | ICD-10-CM

## 2019-07-05 DIAGNOSIS — R1013 Epigastric pain: Secondary | ICD-10-CM

## 2019-07-05 NOTE — Telephone Encounter (Signed)
Spoke to patient. She is scheduled for EGD on 07/26/19 at 830 AM

## 2019-07-05 NOTE — Telephone Encounter (Signed)
Please call pt to help her schedule her EGD. Pt became very frustrated when I tried to schedule her for her appts. She stated that nobody told her that she had to come see the nurse two weeks before her procedure. She stated that she leaves 3 hours away and cannot come on 3 different days, to see the nurse, for her Covid test and then for the procedure. She stated that we are making things too complicated. Pls call her.

## 2019-07-12 ENCOUNTER — Encounter: Payer: Self-pay | Admitting: Gastroenterology

## 2019-07-13 ENCOUNTER — Encounter: Payer: Self-pay | Admitting: Family Medicine

## 2019-07-13 DIAGNOSIS — R1013 Epigastric pain: Secondary | ICD-10-CM

## 2019-07-15 NOTE — Telephone Encounter (Signed)
Can we find out where the referral went to?  Please advise.

## 2019-07-21 ENCOUNTER — Other Ambulatory Visit: Payer: Self-pay | Admitting: Gastroenterology

## 2019-07-21 ENCOUNTER — Ambulatory Visit (INDEPENDENT_AMBULATORY_CARE_PROVIDER_SITE_OTHER): Payer: 59

## 2019-07-21 ENCOUNTER — Other Ambulatory Visit: Payer: Self-pay

## 2019-07-21 DIAGNOSIS — Z1159 Encounter for screening for other viral diseases: Secondary | ICD-10-CM

## 2019-07-22 ENCOUNTER — Other Ambulatory Visit: Payer: Self-pay

## 2019-07-22 ENCOUNTER — Ambulatory Visit
Admission: RE | Admit: 2019-07-22 | Discharge: 2019-07-22 | Disposition: A | Discharge status: Home / Self Care | Payer: 59 | Source: Ambulatory Visit | Attending: Family Medicine | Admitting: Family Medicine

## 2019-07-22 ENCOUNTER — Encounter: Payer: Self-pay | Admitting: Family Medicine

## 2019-07-22 DIAGNOSIS — R1011 Right upper quadrant pain: Secondary | ICD-10-CM | POA: Diagnosis not present

## 2019-07-22 DIAGNOSIS — R1013 Epigastric pain: Secondary | ICD-10-CM

## 2019-07-22 LAB — SARS CORONAVIRUS 2 (TAT 6-24 HRS): SARS Coronavirus 2: NEGATIVE

## 2019-07-22 MED ORDER — IOPAMIDOL (ISOVUE-300) INJECTION 61%
100.0000 mL | Freq: Once | INTRAVENOUS | Status: AC | PRN
  Administered 2019-07-22: 100 mL via INTRAVENOUS

## 2019-07-26 ENCOUNTER — Ambulatory Visit (AMBULATORY_SURGERY_CENTER): Payer: 59 | Admitting: Gastroenterology

## 2019-07-26 ENCOUNTER — Other Ambulatory Visit: Payer: Self-pay

## 2019-07-26 ENCOUNTER — Encounter: Payer: Self-pay | Admitting: Gastroenterology

## 2019-07-26 VITALS — BP 118/82 | HR 69 | Temp 97.7°F | Resp 11 | Ht 67.0 in | Wt 207.0 lb

## 2019-07-26 DIAGNOSIS — R1013 Epigastric pain: Secondary | ICD-10-CM

## 2019-07-26 DIAGNOSIS — K449 Diaphragmatic hernia without obstruction or gangrene: Secondary | ICD-10-CM

## 2019-07-26 DIAGNOSIS — K3189 Other diseases of stomach and duodenum: Secondary | ICD-10-CM

## 2019-07-26 DIAGNOSIS — R109 Unspecified abdominal pain: Secondary | ICD-10-CM | POA: Diagnosis not present

## 2019-07-26 DIAGNOSIS — K219 Gastro-esophageal reflux disease without esophagitis: Secondary | ICD-10-CM | POA: Diagnosis not present

## 2019-07-26 DIAGNOSIS — K2980 Duodenitis without bleeding: Secondary | ICD-10-CM

## 2019-07-26 DIAGNOSIS — R1012 Left upper quadrant pain: Secondary | ICD-10-CM

## 2019-07-26 DIAGNOSIS — R1310 Dysphagia, unspecified: Secondary | ICD-10-CM

## 2019-07-26 DIAGNOSIS — R140 Abdominal distension (gaseous): Secondary | ICD-10-CM

## 2019-07-26 DIAGNOSIS — K259 Gastric ulcer, unspecified as acute or chronic, without hemorrhage or perforation: Secondary | ICD-10-CM

## 2019-07-26 DIAGNOSIS — K2970 Gastritis, unspecified, without bleeding: Secondary | ICD-10-CM

## 2019-07-26 MED ORDER — SODIUM CHLORIDE 0.9 % IV SOLN: 500.0000 mL | Freq: Once | INTRAVENOUS | Status: DC

## 2019-07-26 MED ORDER — OMEPRAZOLE 20 MG PO CPDR: 20.0000 mg | DELAYED_RELEASE_CAPSULE | Freq: Two times a day (BID) | ORAL | 0 refills | Status: DC

## 2019-07-26 NOTE — Op Note (Signed)
[place] Patient Name: Katelyn Matthews Procedure Date: 07/26/2019 8:01 AM MRN: [id number] Endoscopist: Gerrit Heck , MD Age: 54 Referring MD:  Date of Birth: 05-25-1966 Gender: Female Account #: [id number] Procedure:                Upper GI endoscopy Indications:              Epigastric abdominal pain, Abdominal pain in the                            left upper quadrant, Abdominal distention,                            Abdominal bloating                           Evaluation to date has been unrevealing, to include                            CBC, CMP, amylase, lipase, abdominal ultrasound,                            then CT abdomen/pelvis last week. Medicines:                Monitored Anesthesia Care Procedure:                Pre-Anesthesia Assessment:                           - Prior to the procedure, a History and Physical                            was performed, and patient medications and                            allergies were reviewed. The patient's tolerance of                            previous anesthesia was also reviewed. The risks                            and benefits of the procedure and the sedation                            options and risks were discussed with the patient.                            All questions were answered, and informed consent                            was obtained. Prior Anticoagulants: The patient has                            taken no previous anticoagulant or antiplatelet  agents. ASA Grade Assessment: II - A patient with                            mild systemic disease. After reviewing the risks                            and benefits, the patient was deemed in                            satisfactory condition to undergo the procedure.                           After obtaining informed consent, the endoscope was                            passed under direct vision. Throughout the             procedure, the patient's blood pressure, pulse, and                            oxygen saturations were monitored continuously. The                            Endoscope was introduced through the mouth, and                            advanced to the second part of duodenum. The upper                            GI endoscopy was accomplished without difficulty.                            The patient tolerated the procedure well. Scope In: Scope Out: Findings:                 A single area of ectopic gastric mucosa was found                            in the upper third of the esophagus.                           The Z-line was mildly irregular, characterized by                            mild erythema and localized edema, and was found 39                            cm from the incisors. Biopsies were taken with a                            cold forceps for histology. Estimated blood loss                            was minimal.  The middle third of the esophagus and lower third                            of the esophagus were normal.                           The gastroesophageal flap valve was visualized                            endoscopically and classified as Hill Grade III                            (minimal fold, loose to endoscope, hiatal hernia                            likely). A very small, <1 cm, sliding type hiatal                            hernia was noted on anterograde views.                           Scattered mild inflammation characterized by                            erythema was found in the gastric fundus, in the                            gastric body, in the gastric antrum and in the                            prepyloric region of the stomach. There was mucosal                            friability noted in the fundus and proximal gastric                            body. Biopsies were taken with a cold forceps for                             Helicobacter pylori testing. Estimated blood loss                            was minimal.                           The duodenal bulb, first portion of the duodenum                            and second portion of the duodenum were normal.                            Biopsies for histology were taken with a cold  forceps for evaluation of celiac disease. Estimated                            blood loss was minimal. Complications:            No immediate complications. Estimated Blood Loss:     Estimated blood loss was minimal. Impression:               - Ectopic gastric mucosa in the upper third of the                            esophagus.                           - Z-line irregular, 39 cm from the incisors.                            Biopsied.                           - Normal middle third of esophagus and lower third                            of esophagus.                           - Gastroesophageal flap valve classified as Hill                            Grade III (minimal fold, loose to endoscope, hiatal                            hernia likely).                           - Gastritis. Biopsied.                           - Normal duodenal bulb, first portion of the                            duodenum and second portion of the duodenum.                            Biopsied. Recommendation:           - Patient has a contact number available for                            emergencies. The signs and symptoms of potential                            delayed complications were discussed with the                            patient. Return to normal activities tomorrow.  Written discharge instructions were provided to the                            patient.                           - Resume previous diet.                           - Continue present medications.                           - Await pathology results.                            - Use Prilosec (omeprazole) 20 mg PO BID for 6                            weeks to promote further mucosal healing, then can                            reduce to lowest effective dose or titrate off                            completely if no recurrence of symptoms.                           - Return to GI clinic in 2-3 months or sooner as                            needed. Gerrit Heck, MD 07/26/2019 8:27:58 AM

## 2019-07-26 NOTE — Patient Instructions (Signed)
Use Prilosec (omeprazole) 20mg  by mouth twice daily for 6 weeks to promote further mucosal healing, then can reduce to lowest effective dose or titrate off completely if no recurrence of symptoms.  Return to GI clinic in 2-3 months or sooner as needed.   Handout provided on Gastritis.   YOU HAD AN ENDOSCOPIC PROCEDURE TODAY AT [facility]:   Refer to the procedure report that was given to you for any specific questions about what was found during the examination.  If the procedure report does not answer your questions, please call your gastroenterologist to clarify.  If you requested that your care partner not be given the details of your procedure findings, then the procedure report has been included in a sealed envelope for you to review at your convenience later.  YOU SHOULD EXPECT: Some feelings of bloating in the abdomen. Passage of more gas than usual.  Walking can help get rid of the air that was put into your GI tract during the procedure and reduce the bloating. If you had a lower endoscopy (such as a colonoscopy or flexible sigmoidoscopy) you may notice spotting of blood in your stool or on the toilet paper. If you underwent a bowel prep for your procedure, you may not have a normal bowel movement for a few days.  Please Note:  You might notice some irritation and congestion in your nose or some drainage.  This is from the oxygen used during your procedure.  There is no need for concern and it should clear up in a day or so.  SYMPTOMS TO REPORT IMMEDIATELY:    Following upper endoscopy (EGD)  Vomiting of blood or coffee ground material  New chest pain or pain under the shoulder blades  Painful or persistently difficult swallowing  New shortness of breath  Fever of 100F or higher  Black, tarry-looking stools  For urgent or emergent issues, a gastroenterologist can be reached at any hour by calling [phone].   DIET:  We do recommend a small meal at first, but  then you may proceed to your regular diet.  Drink plenty of fluids but you should avoid alcoholic beverages for 24 hours.  ACTIVITY:  You should plan to take it easy for the rest of today and you should NOT DRIVE or use heavy machinery until tomorrow (because of the sedation medicines used during the test).    FOLLOW UP: Our staff will call the number listed on your records 48-72 hours following your procedure to check on you and address any questions or concerns that you may have regarding the information given to you following your procedure. If we do not reach you, we will leave a message.  We will attempt to reach you two times.  During this call, we will ask if you have developed any symptoms of COVID 19. If you develop any symptoms (ie: fever, flu-like symptoms, shortness of breath, cough etc.) before then, please call [phone].  If you test positive for Covid 19 in the 2 weeks post procedure, please call and report this information to [place].    If any biopsies were taken you will be contacted by phone or by letter within the next 1-3 weeks.  Please call us at [phone] if you have not heard about the biopsies in 3 weeks.    SIGNATURES/CONFIDENTIALITY: You and/or your care partner have signed paperwork which will be entered into your electronic medical record.  These signatures attest to the fact that that  the information above on your After Visit Summary has been reviewed and is understood.  Full responsibility of the confidentiality of this discharge information lies with you and/or your care-partner.

## 2019-07-26 NOTE — Progress Notes (Signed)
VS by SM. Temp by JB.

## 2019-07-26 NOTE — Progress Notes (Signed)
To PACU, VSS. Report to [email or web address] 

## 2019-07-28 ENCOUNTER — Telehealth: Payer: Self-pay | Admitting: *Deleted

## 2019-07-28 ENCOUNTER — Telehealth: Payer: Self-pay

## 2019-07-28 NOTE — Telephone Encounter (Signed)
  Follow up Call-  Left Message

## 2019-07-28 NOTE — Telephone Encounter (Signed)
  Follow up Call-  Call back number 07/26/2019  Post procedure Call Back phone  # [phone]  Permission to leave phone message Yes  Some recent data might be hidden     Patient questions:  Do you have a fever, pain , or abdominal swelling? No. Pain Score  0 *  Have you tolerated food without any problems? Yes.    Have you been able to return to your normal activities? Yes.    Do you have any questions about your discharge instructions: Diet   No. Medications  No. Follow up visit  No.  Do you have questions or concerns about your Care? No.  Actions: * If pain score is 4 or above: No action needed, pain <4.   1. Have you developed a fever since your procedure? no  2.   Have you had an respiratory symptoms (SOB or cough) since your procedure? no  3.   Have you tested positive for COVID 19 since your procedure no  4.   Have you had any family members/close contacts diagnosed with the COVID 19 since your procedure?  no   If yes to any of these questions please route to Joylene John, RN and Alphonsa Gin, Therapist, sports.

## 2019-08-05 ENCOUNTER — Encounter: Payer: Self-pay | Admitting: Gastroenterology

## 2019-08-08 DIAGNOSIS — R1013 Epigastric pain: Secondary | ICD-10-CM

## 2019-08-08 MED ORDER — SUCRALFATE 1 G PO TABS: 1.0000 g | ORAL_TABLET | Freq: Four times a day (QID) | ORAL | 2 refills | Status: DC

## 2019-08-08 NOTE — Telephone Encounter (Addendum)
Called and spoke with patient-patient informed of MD recommendations; patient is agreeable with plan of care and verified pharmacy; RX has been sent to pharmacy;  Patient verbalized understanding of information/instructions;  Patient was advised to call the office at [phone] if questions/concerns arise; patient has been scheduled for a f/u appt at the [facility] office on 09/06/2019 at 11:00 am with Dr. Bryan Lemma;

## 2019-09-06 ENCOUNTER — Other Ambulatory Visit: Payer: Self-pay

## 2019-09-06 ENCOUNTER — Ambulatory Visit: Payer: 59 | Admitting: Gastroenterology

## 2019-09-06 ENCOUNTER — Encounter: Payer: Self-pay | Admitting: Gastroenterology

## 2019-09-06 ENCOUNTER — Ambulatory Visit (INDEPENDENT_AMBULATORY_CARE_PROVIDER_SITE_OTHER): Payer: 59 | Admitting: Gastroenterology

## 2019-09-06 VITALS — BP 120/86 | HR 81 | Temp 98.4°F | Ht 67.0 in | Wt 208.1 lb

## 2019-09-06 DIAGNOSIS — R1012 Left upper quadrant pain: Secondary | ICD-10-CM | POA: Diagnosis not present

## 2019-09-06 DIAGNOSIS — K2970 Gastritis, unspecified, without bleeding: Secondary | ICD-10-CM | POA: Diagnosis not present

## 2019-09-06 DIAGNOSIS — K30 Functional dyspepsia: Secondary | ICD-10-CM | POA: Diagnosis not present

## 2019-09-06 MED ORDER — OMEPRAZOLE 20 MG PO CPDR: DELAYED_RELEASE_CAPSULE | ORAL | 3 refills | Status: AC

## 2019-09-06 NOTE — Patient Instructions (Signed)
We have given you samples of the following medication to take:  FD Guard take 2 tablets twice daily  Return in 6 months  It was a pleasure to see you today!  Vito Cirigliano, D.O.

## 2019-09-06 NOTE — Progress Notes (Signed)
P  Chief Complaint:    Abdominal pain, procedure follow-up  GI History: 54 year old female follows in the GI clinic for intermittent epigastric/LUQ and left upper back pain, along with intermittent bloating and abdominal distention.  Symptoms started 03/2019.  No improvement with trial of Nexium, some improvement with trial of Carafate.  Has not identified specific triggers.  Had eliminated coffee (but does ok with cold brew coffee) with improvement. Possibly triggered with soda, fried foods.   Separately, she c/o rare episodes of pill dysphagia which has since resolved. No food impactions.  No reflux symptoms.    Evaluation to date: -05/20/2019: ER at [facility] in [place], [place]. Labs (CBC, CMP, amylase, lipase), CXR, abdominal xray - WNL. Symptoms did improve somewhat with GI cocktail. -06/15/2019: Normal abdominal ultrasound -EGD 07/26/2019: Mild gastritis, mildly irregular Z-line.  Treated with omeprazole 20 mg bid  No known family history of CRC, GI malignancy, liver disease, pancreatic disease, or IBD.   Endoscopic Hx: -EGD (07/2019, Dr. Bryan Lemma): Benign gastric inlet patch, mildly irregular Z line (bxs: Reflux changes, no IM), Hill grade 3 valve but <1 cm HH, mild gastritis - Colonoscopy (2017, [place]): Normal per patient with 10 year recall. Requested records today - EGD approx 20+ years ago for stomach issues- gastritis per patient  HPI:     [name] presenting to the Gastroenterology Clinic for follow-up.  Initially seen in 06/2019, with subsequent EGD completed 07/2019, notable for mild non-H. pylori gastritis and mild reflux changes in esophagus.  Treated with bid PPI.  Previously recommended low FODMAP diet (as a trial), trial of probiotics and diet dietary, along with continued Carafate.  Today, she states sxs are improved, but still present, described as intermittent LUQ discomfort.  Not frank.  Otherwise tolerating p.o. intake without  issue.  Take meds as prescribed.  No dysphagia.  Review of systems:     No chest pain, no SOB, no fevers, no urinary sx   Past Medical History:  Diagnosis Date  . Gastritis   . Hypertension   . Thyroid disease    2006 carcinoma surgery     Patient's surgical history, family medical history, social history, medications and allergies were all reviewed in Epic    Current Outpatient Medications  Medication Sig Dispense Refill  . lisinopril (ZESTRIL) 10 MG tablet Take 1 tablet (10 mg total) by mouth daily. 90 tablet 3  . omeprazole (PRILOSEC) 20 MG capsule Take 1 capsule (20 mg total) by mouth 2 (two) times daily before a meal. 90 capsule 0  . sucralfate (CARAFATE) 1 g tablet Take 1 tablet (1 g total) by mouth 4 (four) times daily. 120 tablet 2  . SYNTHROID 112 MCG tablet Take 1 tablet (112 mcg total) by mouth daily before breakfast. 90 tablet 3   No current facility-administered medications for this visit.    Physical Exam:     BP 120/86   Pulse 81   Temp 98.4 F (36.9 C)   Ht 5\' 7"  (1.702 m)   Wt 208 lb 2 oz (94.4 kg)   BMI 32.60 kg/m   GENERAL:  Pleasant female in NAD PSYCH: : Cooperative, normal affect NEURO: Alert and oriented x 3, no focal neurologic deficits   IMPRESSION and PLAN:    1) Nonulcer gastritis 2) Dyspepsia/LUQ pain 3) Abdominal bloating Overall clinical improvement.  Feels that the most improvement came with the Carafate. -Resume Carafate as currently doing -Complete 8-week course of omeprazole 20 mg  bid, then reduce to daily.  If symptoms still stable, can trial off PPI therapy completely -Provided refill of omeprazole today -Start trial of FDGuard.  Provided with samples today -Mildly irregular Z-line noted at time of EGD, with biopsies consistent with reflux changes.  She is otherwise without typical reflux symptoms.  Monitor for clinical change when titrating PPI down as above.  If breakthrough symptoms, may require long-term low-dose PPI  RTC  in 6 months or sooner as needed         Katelyn Matthews ,DO, FACG 09/06/2019, 11:36 AM

## 2019-11-14 ENCOUNTER — Encounter: Payer: Self-pay | Admitting: Family Medicine

## 2019-11-16 ENCOUNTER — Encounter: Payer: Self-pay | Admitting: Family Medicine

## 2019-11-16 ENCOUNTER — Other Ambulatory Visit: Payer: Self-pay

## 2019-11-16 DIAGNOSIS — I10 Essential (primary) hypertension: Secondary | ICD-10-CM

## 2019-11-16 MED ORDER — LISINOPRIL 10 MG PO TABS: 10.0000 mg | ORAL_TABLET | Freq: Every day | ORAL | 0 refills | Status: DC

## 2020-01-02 ENCOUNTER — Encounter: Payer: Self-pay | Admitting: Family Medicine

## 2020-01-02 DIAGNOSIS — E039 Hypothyroidism, unspecified: Secondary | ICD-10-CM

## 2020-01-04 MED ORDER — SYNTHROID 112 MCG PO TABS: 112.0000 ug | ORAL_TABLET | Freq: Every day | ORAL | 0 refills | Status: AC

## 2020-01-04 MED ORDER — SYNTHROID 112 MCG PO TABS: 112.0000 ug | ORAL_TABLET | Freq: Every day | ORAL | 0 refills | Status: DC

## 2020-01-04 NOTE — Telephone Encounter (Signed)
Refill for 90 day supply faxed to her pharm (e-prescribing "failed" x 2). Yes, please schedule annual CPE w/ fasting labs soon

## 2020-01-15 ENCOUNTER — Encounter: Payer: Self-pay | Admitting: Family Medicine

## 2020-01-19 ENCOUNTER — Encounter: Payer: Self-pay | Admitting: Family Medicine

## 2020-01-19 ENCOUNTER — Telehealth (INDEPENDENT_AMBULATORY_CARE_PROVIDER_SITE_OTHER): Payer: 59 | Admitting: Family Medicine

## 2020-01-19 DIAGNOSIS — Z7189 Other specified counseling: Secondary | ICD-10-CM | POA: Diagnosis not present

## 2020-01-19 DIAGNOSIS — R1013 Epigastric pain: Secondary | ICD-10-CM

## 2020-01-19 NOTE — Telephone Encounter (Signed)
Pt already seen

## 2020-01-19 NOTE — Progress Notes (Addendum)
Virtual Visit via Video Note  I connected with Katelyn Matthews on 01/19/20 at  4:00 PM EDT by a video enabled telemedicine application and verified that I am speaking with the correct person using two identifiers. Location patient: home Location provider: work  Persons participating in the virtual visit: patient, provider  I discussed the limitations of evaluation and management by telemedicine and the availability of in person appointments. The patient expressed understanding and agreed to proceed.  Interactive audio and video telecommunications were attempted between myself and the patient, however failed, due to the patient having technical difficulties. We continued and completed the visit with audio only.   Chief Complaint  Patient presents with  . Other    Pt wants to discuss the covid vaccine    HPI: Katelyn Matthews is a 54 y.o. female seen today to discuss concerns about covid vaccine. 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 Pt feels it is not fair or right that it be required/mandated. She voices concern about having high cholesterol and then the risk of blood clots from the vaccine.  She is looking for a letter of medical exemption so she does not have to receive the vaccine.    Past Medical History:  Diagnosis Date  . Gastritis   . Hypertension   . Thyroid disease    2006 carcinoma surgery     Past Surgical History:  Procedure Laterality Date  . carcinoma removal  2005  . COLONOSCOPY  08/03/2015   In [place]. Said things were fine   . ESOPHAGOGASTRODUODENOSCOPY     over 20 years ago. Said she was dx with gastritis  . ganglion cyst removed    . TONSILLECTOMY      Family History  Adopted: Yes  Problem Relation Age of Onset  . Breast cancer Mother   . Esophageal cancer Neg Hx   . Stomach cancer Neg Hx   . Rectal cancer Neg Hx     Social History   Tobacco Use  . Smoking status: Never Smoker  .  Smokeless tobacco: Never Used  . Tobacco comment: said that she socially had a puff when she was younger  Vaping Use  . Vaping Use: Never used  Substance Use Topics  . Alcohol use: Yes    Comment: socially  . Drug use: Never     Current Outpatient Medications:  .  lisinopril (ZESTRIL) 10 MG tablet, Take 1 tablet (10 mg total) by mouth daily., Disp: 90 tablet, Rfl: 0 .  omeprazole (PRILOSEC) 20 MG capsule, Take Omeprazole 20 mg twice daily for 2 weeks then once daily., Disp: 60 capsule, Rfl: 3 .  sucralfate (CARAFATE) 1 g tablet, Take 1 tablet (1 g total) by mouth 4 (four) times daily., Disp: 120 tablet, Rfl: 2 .  SYNTHROID 112 MCG tablet, Take 1 tablet (112 mcg total) by mouth daily before breakfast., Disp: 90 tablet, Rfl: 0  No Known Allergies    ROS: See pertinent positives and negatives per HPI.   EXAM:  VITALS per patient if applicable: There were no vitals taken for this visit.   GENERAL: alert, oriented, no acute distress  LUNGS: no SOB, gasping or wheezing, no conversational dyspnea  PSYCH/NEURO: pt is short and has an irritated tone, speech and thought processing grossly intact   ASSESSMENT AND PLAN: 1. Counseled about COVID-19 virus infection - pt is very upset about recent [place] covid vaccine requirement - pt not  very willing to engage in conversation or discuss her concerns, despite my attempt to understand her concerns and possibly provide reliable, scientific information to help her feel more comfortable with the vaccine - pt requesting letter of medical exemption and I explained to her that she does not have a medical reason to not receive the vaccine - pt upset with my refusal to provide covid vaccine exemption and had no further issues to discuss   I discussed the assessment and treatment plan with the patient. The patient was provided an opportunity to ask questions and all were answered. The patient agreed with the plan and demonstrated an  understanding of the instructions.   The patient was advised to call back or seek an in-person evaluation if the symptoms worsen or if the condition fails to improve as anticipated.  I spent 11 min with the patient today and greater than 50% was done via telephone.   Letta Median, DO

## 2020-01-20 MED ORDER — SUCRALFATE 1 G PO TABS: 1.0000 g | ORAL_TABLET | Freq: Four times a day (QID) | ORAL | 2 refills | Status: AC

## 2020-01-20 NOTE — Telephone Encounter (Signed)
Last VV 01/19/20 Last fill 08/08/19  #120/2

## 2020-01-25 NOTE — Addendum Note (Signed)
Addended by: Ronnald Nian on: 01/25/2020 12:40 PM   Modules accepted: Level of Service

## 2020-02-09 DIAGNOSIS — N611 Abscess of the breast and nipple: Secondary | ICD-10-CM | POA: Diagnosis not present

## 2020-02-17 ENCOUNTER — Encounter: Payer: Self-pay | Admitting: Family Medicine

## 2020-02-17 DIAGNOSIS — I10 Essential (primary) hypertension: Secondary | ICD-10-CM

## 2020-02-17 MED ORDER — LISINOPRIL 10 MG PO TABS: 10.0000 mg | ORAL_TABLET | Freq: Every day | ORAL | 0 refills | Status: AC

## 2020-02-27 ENCOUNTER — Encounter: Payer: Self-pay | Admitting: Family Medicine

## 2020-03-02 ENCOUNTER — Ambulatory Visit: Payer: 59 | Admitting: Family Medicine

## 2020-04-04 DIAGNOSIS — R42 Dizziness and giddiness: Secondary | ICD-10-CM | POA: Diagnosis not present

## 2020-04-04 DIAGNOSIS — I10 Essential (primary) hypertension: Secondary | ICD-10-CM | POA: Diagnosis not present

## 2020-04-04 DIAGNOSIS — Z79899 Other long term (current) drug therapy: Secondary | ICD-10-CM | POA: Diagnosis not present

## 2020-04-04 DIAGNOSIS — E039 Hypothyroidism, unspecified: Secondary | ICD-10-CM | POA: Diagnosis not present

## 2020-04-05 DIAGNOSIS — I10 Essential (primary) hypertension: Secondary | ICD-10-CM | POA: Diagnosis not present

## 2020-04-05 DIAGNOSIS — Z124 Encounter for screening for malignant neoplasm of cervix: Secondary | ICD-10-CM | POA: Diagnosis not present

## 2020-04-05 DIAGNOSIS — Z1159 Encounter for screening for other viral diseases: Secondary | ICD-10-CM | POA: Diagnosis not present

## 2020-04-05 DIAGNOSIS — Z1231 Encounter for screening mammogram for malignant neoplasm of breast: Secondary | ICD-10-CM | POA: Diagnosis not present

## 2020-04-05 DIAGNOSIS — E039 Hypothyroidism, unspecified: Secondary | ICD-10-CM | POA: Diagnosis not present

## 2020-04-05 DIAGNOSIS — C801 Malignant (primary) neoplasm, unspecified: Secondary | ICD-10-CM | POA: Diagnosis not present

## 2020-04-05 DIAGNOSIS — E785 Hyperlipidemia, unspecified: Secondary | ICD-10-CM | POA: Diagnosis not present

## 2020-04-05 DIAGNOSIS — H8111 Benign paroxysmal vertigo, right ear: Secondary | ICD-10-CM | POA: Diagnosis not present

## 2020-04-05 DIAGNOSIS — R7309 Other abnormal glucose: Secondary | ICD-10-CM | POA: Diagnosis not present

## 2020-04-05 DIAGNOSIS — K257 Chronic gastric ulcer without hemorrhage or perforation: Secondary | ICD-10-CM | POA: Diagnosis not present

## 2020-04-13 DIAGNOSIS — Z1231 Encounter for screening mammogram for malignant neoplasm of breast: Secondary | ICD-10-CM | POA: Diagnosis not present

## 2020-08-08 DIAGNOSIS — R140 Abdominal distension (gaseous): Secondary | ICD-10-CM | POA: Diagnosis not present

## 2020-08-08 DIAGNOSIS — E039 Hypothyroidism, unspecified: Secondary | ICD-10-CM | POA: Diagnosis not present

## 2020-08-08 DIAGNOSIS — R1012 Left upper quadrant pain: Secondary | ICD-10-CM | POA: Diagnosis not present

## 2020-08-08 DIAGNOSIS — K257 Chronic gastric ulcer without hemorrhage or perforation: Secondary | ICD-10-CM | POA: Diagnosis not present

## 2020-08-13 DIAGNOSIS — R140 Abdominal distension (gaseous): Secondary | ICD-10-CM | POA: Diagnosis not present

## 2020-08-13 DIAGNOSIS — Z85850 Personal history of malignant neoplasm of thyroid: Secondary | ICD-10-CM | POA: Diagnosis not present

## 2020-08-21 DIAGNOSIS — R140 Abdominal distension (gaseous): Secondary | ICD-10-CM | POA: Diagnosis not present

## 2020-08-22 DIAGNOSIS — R1084 Generalized abdominal pain: Secondary | ICD-10-CM | POA: Diagnosis not present

## 2020-08-22 DIAGNOSIS — R140 Abdominal distension (gaseous): Secondary | ICD-10-CM | POA: Diagnosis not present

## 2020-09-11 DIAGNOSIS — Z01419 Encounter for gynecological examination (general) (routine) without abnormal findings: Secondary | ICD-10-CM | POA: Diagnosis not present

## 2020-09-11 DIAGNOSIS — Z1239 Encounter for other screening for malignant neoplasm of breast: Secondary | ICD-10-CM | POA: Diagnosis not present

## 2020-09-24 DIAGNOSIS — E039 Hypothyroidism, unspecified: Secondary | ICD-10-CM | POA: Diagnosis not present

## 2020-09-24 DIAGNOSIS — E890 Postprocedural hypothyroidism: Secondary | ICD-10-CM | POA: Diagnosis not present

## 2020-09-24 DIAGNOSIS — Z6833 Body mass index (BMI) 33.0-33.9, adult: Secondary | ICD-10-CM | POA: Diagnosis not present

## 2020-09-24 DIAGNOSIS — Z79899 Other long term (current) drug therapy: Secondary | ICD-10-CM | POA: Diagnosis not present

## 2020-09-24 DIAGNOSIS — Z85850 Personal history of malignant neoplasm of thyroid: Secondary | ICD-10-CM | POA: Diagnosis not present

## 2020-10-17 DIAGNOSIS — R3915 Urgency of urination: Secondary | ICD-10-CM | POA: Diagnosis not present

## 2020-10-17 DIAGNOSIS — N398 Other specified disorders of urinary system: Secondary | ICD-10-CM | POA: Diagnosis not present

## 2020-10-17 DIAGNOSIS — N393 Stress incontinence (female) (male): Secondary | ICD-10-CM | POA: Diagnosis not present

## 2020-11-01 DIAGNOSIS — R140 Abdominal distension (gaseous): Secondary | ICD-10-CM | POA: Diagnosis not present

## 2020-11-01 DIAGNOSIS — K2900 Acute gastritis without bleeding: Secondary | ICD-10-CM | POA: Diagnosis not present

## 2020-11-01 DIAGNOSIS — K229 Disease of esophagus, unspecified: Secondary | ICD-10-CM | POA: Diagnosis not present

## 2020-11-01 DIAGNOSIS — R1013 Epigastric pain: Secondary | ICD-10-CM | POA: Diagnosis not present

## 2020-11-01 DIAGNOSIS — K2289 Other specified disease of esophagus: Secondary | ICD-10-CM | POA: Diagnosis not present

## 2020-11-01 DIAGNOSIS — K2970 Gastritis, unspecified, without bleeding: Secondary | ICD-10-CM | POA: Diagnosis not present

## 2020-11-01 DIAGNOSIS — K208 Other esophagitis without bleeding: Secondary | ICD-10-CM | POA: Diagnosis not present

## 2020-11-08 DIAGNOSIS — R39198 Other difficulties with micturition: Secondary | ICD-10-CM | POA: Diagnosis not present

## 2020-11-08 DIAGNOSIS — R300 Dysuria: Secondary | ICD-10-CM | POA: Diagnosis not present

## 2020-11-08 DIAGNOSIS — N319 Neuromuscular dysfunction of bladder, unspecified: Secondary | ICD-10-CM | POA: Diagnosis not present

## 2020-11-08 DIAGNOSIS — R3915 Urgency of urination: Secondary | ICD-10-CM | POA: Diagnosis not present

## 2020-11-08 DIAGNOSIS — R8289 Other abnormal findings on cytological and histological examination of urine: Secondary | ICD-10-CM | POA: Diagnosis not present

## 2020-11-08 DIAGNOSIS — N393 Stress incontinence (female) (male): Secondary | ICD-10-CM | POA: Diagnosis not present

## 2020-11-20 DIAGNOSIS — N393 Stress incontinence (female) (male): Secondary | ICD-10-CM | POA: Diagnosis not present

## 2020-11-23 DIAGNOSIS — N393 Stress incontinence (female) (male): Secondary | ICD-10-CM | POA: Diagnosis not present

## 2020-11-23 DIAGNOSIS — N319 Neuromuscular dysfunction of bladder, unspecified: Secondary | ICD-10-CM | POA: Diagnosis not present

## 2020-11-23 DIAGNOSIS — E890 Postprocedural hypothyroidism: Secondary | ICD-10-CM | POA: Diagnosis not present

## 2020-11-23 DIAGNOSIS — E039 Hypothyroidism, unspecified: Secondary | ICD-10-CM | POA: Diagnosis not present

## 2020-11-23 DIAGNOSIS — R300 Dysuria: Secondary | ICD-10-CM | POA: Diagnosis not present

## 2020-11-23 DIAGNOSIS — E785 Hyperlipidemia, unspecified: Secondary | ICD-10-CM | POA: Diagnosis not present

## 2020-11-23 DIAGNOSIS — Z0000 Encounter for general adult medical examination without abnormal findings: Secondary | ICD-10-CM | POA: Diagnosis not present

## 2020-11-23 DIAGNOSIS — K581 Irritable bowel syndrome with constipation: Secondary | ICD-10-CM | POA: Diagnosis not present

## 2020-11-23 DIAGNOSIS — I10 Essential (primary) hypertension: Secondary | ICD-10-CM | POA: Diagnosis not present

## 2020-11-23 DIAGNOSIS — R39198 Other difficulties with micturition: Secondary | ICD-10-CM | POA: Diagnosis not present

## 2020-11-23 DIAGNOSIS — R140 Abdominal distension (gaseous): Secondary | ICD-10-CM | POA: Diagnosis not present

## 2020-11-23 DIAGNOSIS — C801 Malignant (primary) neoplasm, unspecified: Secondary | ICD-10-CM | POA: Diagnosis not present

## 2021-04-01 DIAGNOSIS — Z01818 Encounter for other preprocedural examination: Secondary | ICD-10-CM | POA: Diagnosis not present

## 2021-04-03 DIAGNOSIS — R1012 Left upper quadrant pain: Secondary | ICD-10-CM | POA: Diagnosis not present

## 2021-04-03 DIAGNOSIS — K5909 Other constipation: Secondary | ICD-10-CM | POA: Diagnosis not present

## 2021-04-03 DIAGNOSIS — R140 Abdominal distension (gaseous): Secondary | ICD-10-CM | POA: Diagnosis not present

## 2021-04-03 DIAGNOSIS — R1010 Upper abdominal pain, unspecified: Secondary | ICD-10-CM | POA: Diagnosis not present

## 2021-04-05 DIAGNOSIS — R39198 Other difficulties with micturition: Secondary | ICD-10-CM | POA: Diagnosis not present

## 2021-04-05 DIAGNOSIS — N3946 Mixed incontinence: Secondary | ICD-10-CM | POA: Diagnosis not present

## 2021-04-05 DIAGNOSIS — E669 Obesity, unspecified: Secondary | ICD-10-CM | POA: Diagnosis not present

## 2021-04-05 DIAGNOSIS — N398 Other specified disorders of urinary system: Secondary | ICD-10-CM | POA: Diagnosis not present

## 2021-04-05 DIAGNOSIS — E039 Hypothyroidism, unspecified: Secondary | ICD-10-CM | POA: Diagnosis not present

## 2021-04-05 DIAGNOSIS — R3915 Urgency of urination: Secondary | ICD-10-CM | POA: Diagnosis not present

## 2021-04-05 DIAGNOSIS — Z6833 Body mass index (BMI) 33.0-33.9, adult: Secondary | ICD-10-CM | POA: Diagnosis not present

## 2021-04-05 DIAGNOSIS — Z9884 Bariatric surgery status: Secondary | ICD-10-CM | POA: Diagnosis not present

## 2021-04-05 DIAGNOSIS — N393 Stress incontinence (female) (male): Secondary | ICD-10-CM | POA: Diagnosis not present

## 2021-04-05 DIAGNOSIS — E785 Hyperlipidemia, unspecified: Secondary | ICD-10-CM | POA: Diagnosis not present

## 2021-04-05 DIAGNOSIS — Z87891 Personal history of nicotine dependence: Secondary | ICD-10-CM | POA: Diagnosis not present

## 2021-04-05 DIAGNOSIS — I10 Essential (primary) hypertension: Secondary | ICD-10-CM | POA: Diagnosis not present

## 2021-05-13 DIAGNOSIS — E039 Hypothyroidism, unspecified: Secondary | ICD-10-CM | POA: Diagnosis not present

## 2021-05-13 DIAGNOSIS — K219 Gastro-esophageal reflux disease without esophagitis: Secondary | ICD-10-CM | POA: Diagnosis not present

## 2021-05-13 DIAGNOSIS — R1010 Upper abdominal pain, unspecified: Secondary | ICD-10-CM | POA: Diagnosis not present

## 2021-05-13 DIAGNOSIS — R140 Abdominal distension (gaseous): Secondary | ICD-10-CM | POA: Diagnosis not present

## 2021-05-13 DIAGNOSIS — Z85850 Personal history of malignant neoplasm of thyroid: Secondary | ICD-10-CM | POA: Diagnosis not present

## 2021-05-13 DIAGNOSIS — E7800 Pure hypercholesterolemia, unspecified: Secondary | ICD-10-CM | POA: Diagnosis not present

## 2021-05-13 DIAGNOSIS — I10 Essential (primary) hypertension: Secondary | ICD-10-CM | POA: Diagnosis not present

## 2021-05-16 DIAGNOSIS — R1010 Upper abdominal pain, unspecified: Secondary | ICD-10-CM | POA: Diagnosis not present

## 2021-05-16 DIAGNOSIS — E7800 Pure hypercholesterolemia, unspecified: Secondary | ICD-10-CM | POA: Diagnosis not present

## 2021-05-16 DIAGNOSIS — K219 Gastro-esophageal reflux disease without esophagitis: Secondary | ICD-10-CM | POA: Diagnosis not present

## 2021-05-16 DIAGNOSIS — R140 Abdominal distension (gaseous): Secondary | ICD-10-CM | POA: Diagnosis not present

## 2021-05-16 DIAGNOSIS — Z85850 Personal history of malignant neoplasm of thyroid: Secondary | ICD-10-CM | POA: Diagnosis not present

## 2021-05-16 DIAGNOSIS — E669 Obesity, unspecified: Secondary | ICD-10-CM | POA: Diagnosis not present

## 2021-05-16 DIAGNOSIS — K760 Fatty (change of) liver, not elsewhere classified: Secondary | ICD-10-CM | POA: Diagnosis not present

## 2021-05-16 DIAGNOSIS — I10 Essential (primary) hypertension: Secondary | ICD-10-CM | POA: Diagnosis not present

## 2021-05-16 DIAGNOSIS — E039 Hypothyroidism, unspecified: Secondary | ICD-10-CM | POA: Diagnosis not present

## 2021-05-23 DIAGNOSIS — R1010 Upper abdominal pain, unspecified: Secondary | ICD-10-CM | POA: Diagnosis not present

## 2021-06-04 DIAGNOSIS — I10 Essential (primary) hypertension: Secondary | ICD-10-CM | POA: Diagnosis not present

## 2021-06-04 DIAGNOSIS — K562 Volvulus: Secondary | ICD-10-CM | POA: Diagnosis not present

## 2021-06-04 DIAGNOSIS — R1010 Upper abdominal pain, unspecified: Secondary | ICD-10-CM | POA: Diagnosis not present

## 2021-06-04 DIAGNOSIS — E039 Hypothyroidism, unspecified: Secondary | ICD-10-CM | POA: Diagnosis not present

## 2021-06-04 DIAGNOSIS — Q438 Other specified congenital malformations of intestine: Secondary | ICD-10-CM | POA: Diagnosis not present

## 2021-06-04 DIAGNOSIS — K2970 Gastritis, unspecified, without bleeding: Secondary | ICD-10-CM | POA: Diagnosis not present

## 2021-06-04 DIAGNOSIS — K2100 Gastro-esophageal reflux disease with esophagitis, without bleeding: Secondary | ICD-10-CM | POA: Diagnosis not present

## 2021-06-04 DIAGNOSIS — Z85850 Personal history of malignant neoplasm of thyroid: Secondary | ICD-10-CM | POA: Diagnosis not present

## 2021-06-13 IMAGING — US US ABDOMEN LIMITED
1 series · 14 of 25 positions shown · non-contrast
Comparison: None.

CLINICAL DATA: Upper abdominal pain

EXAM:
ULTRASOUND ABDOMEN LIMITED RIGHT UPPER QUADRANT

[Series 1: us abdomen limited · 0.20mm/px · 14 of 73 slices shown]
[im 1/73]
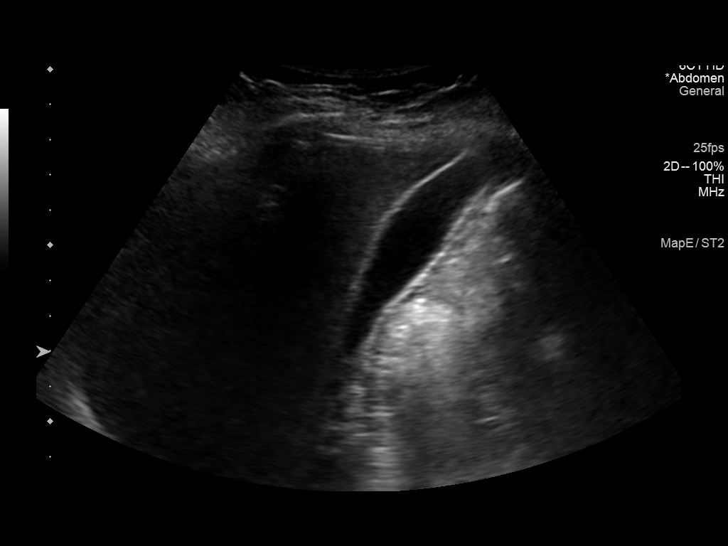
[im 7/73]
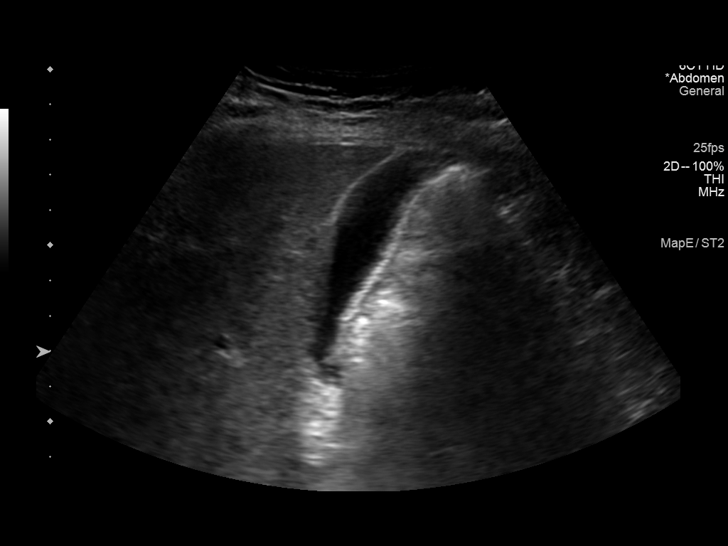
[im 13/73]
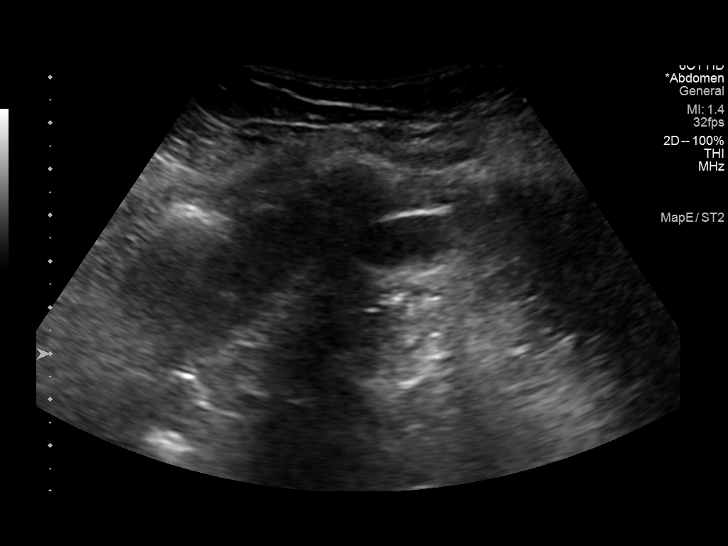
[im 19/73]
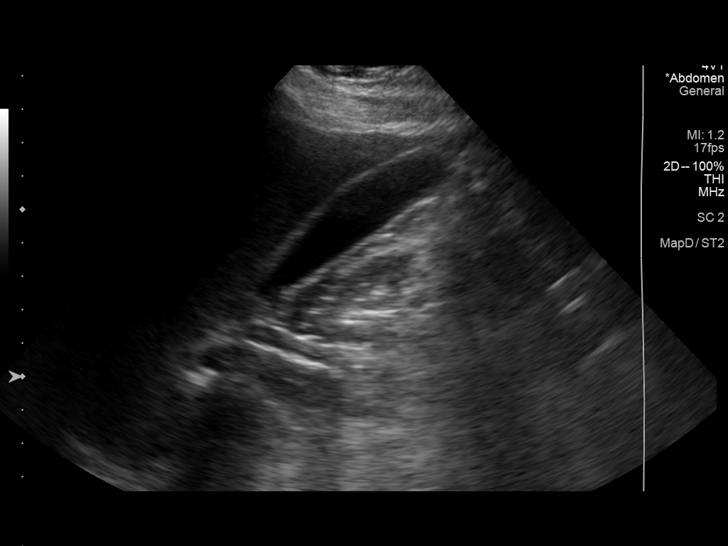
[im 25/73]
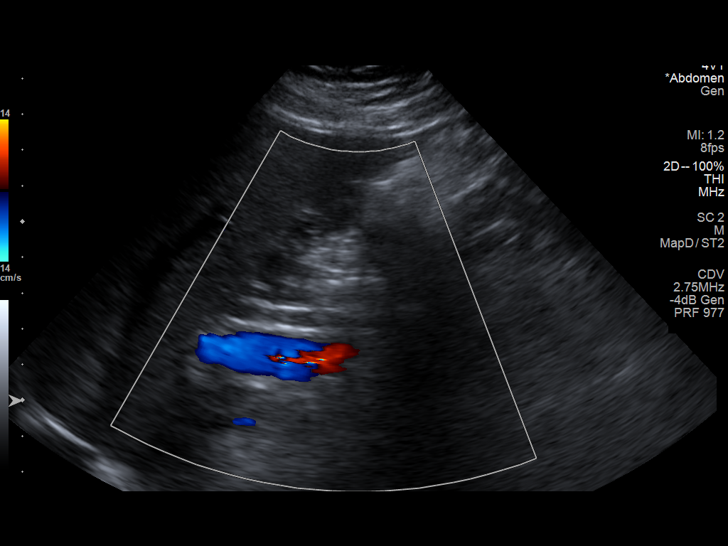
[im 28/73]
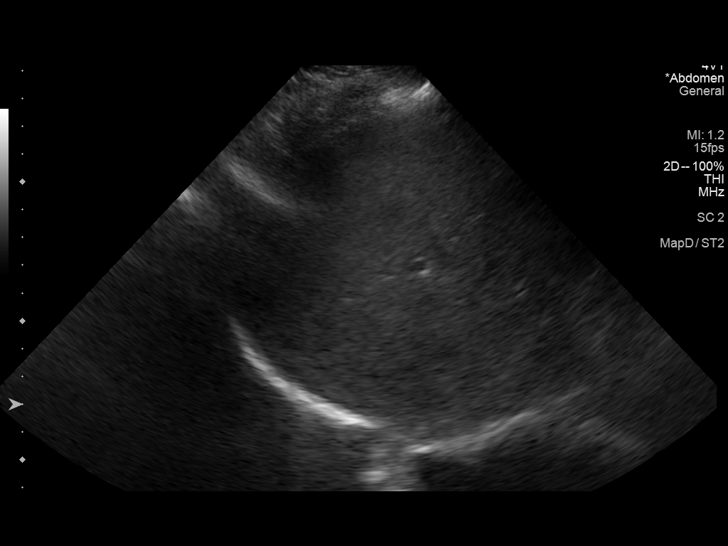
[im 34/73]
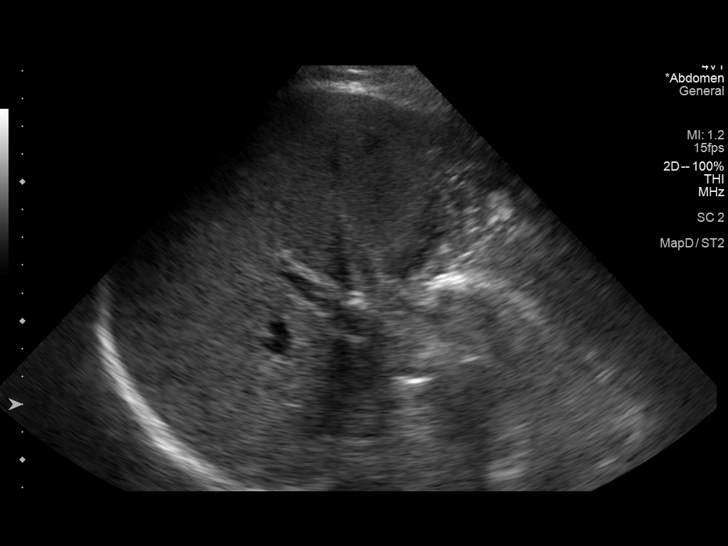
[im 40/73]
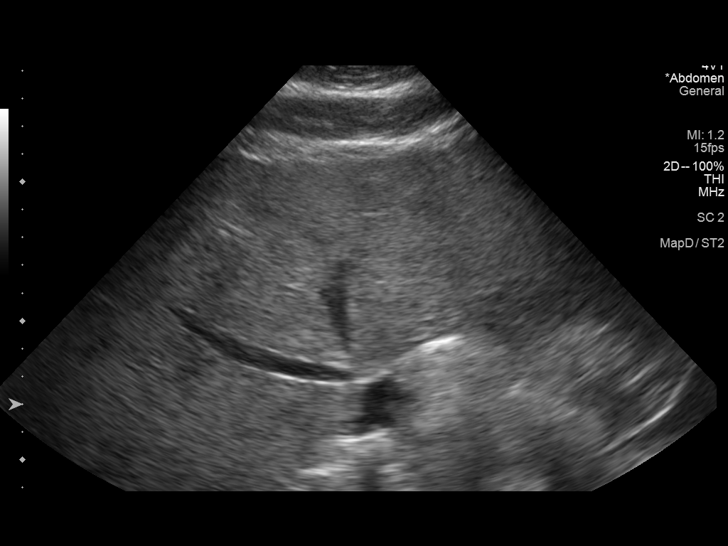
[im 46/73]
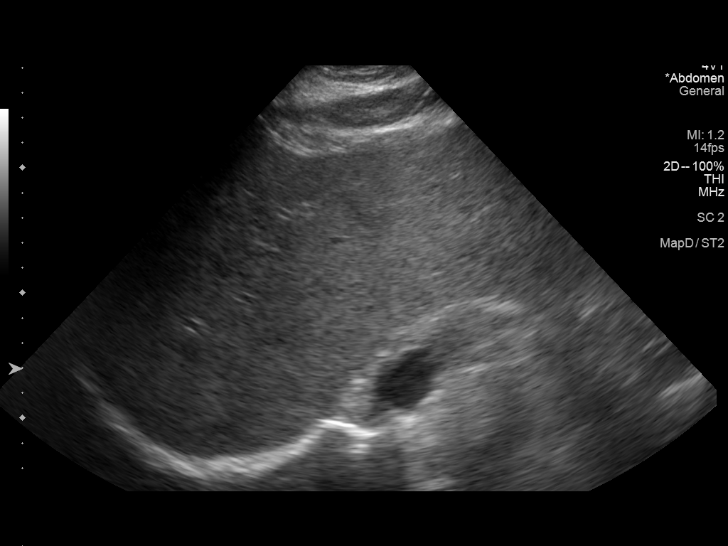
[im 49/73]
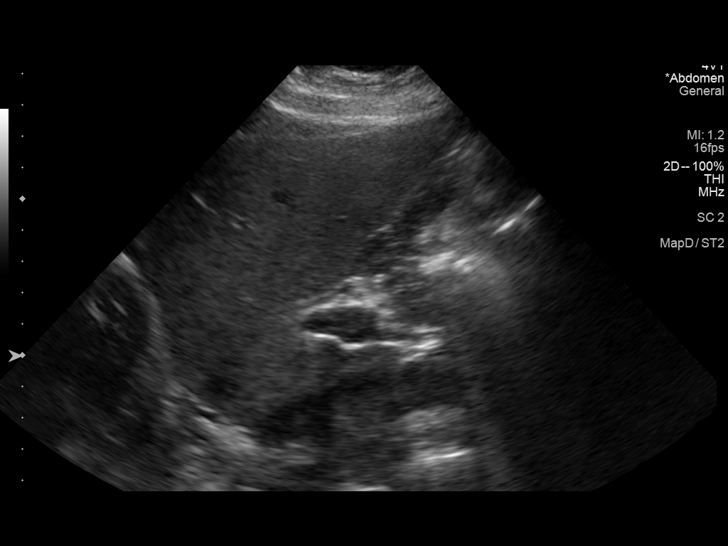
[im 55/73]
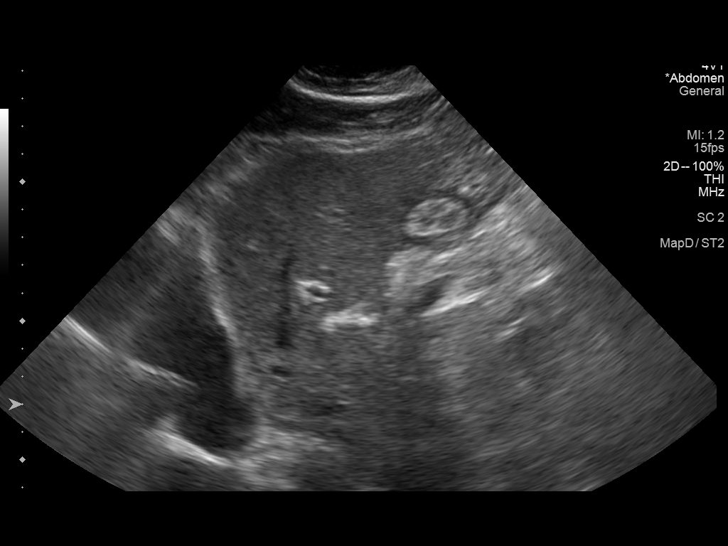
[im 61/73]
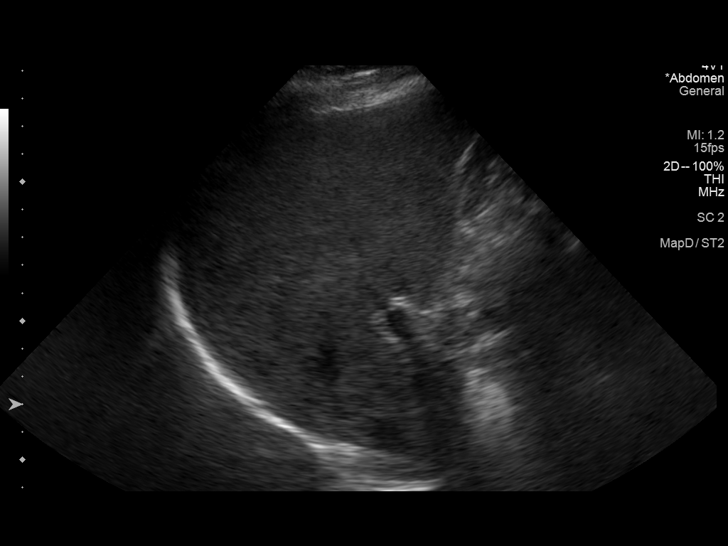
[im 67/73]
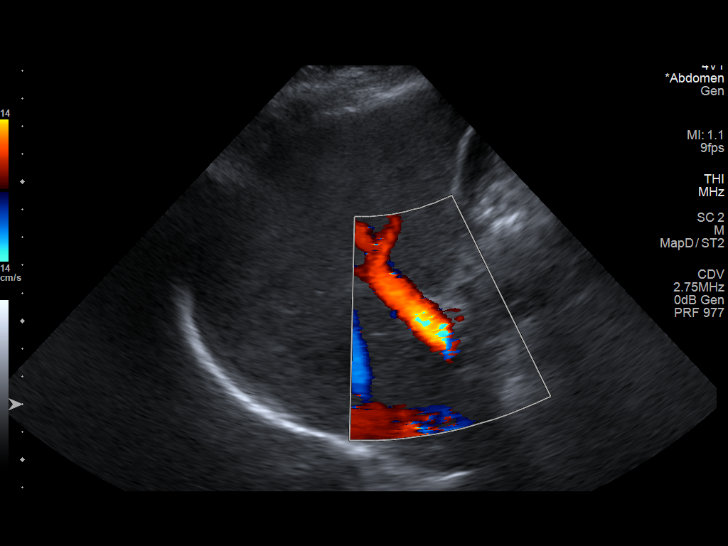
[im 73/73]
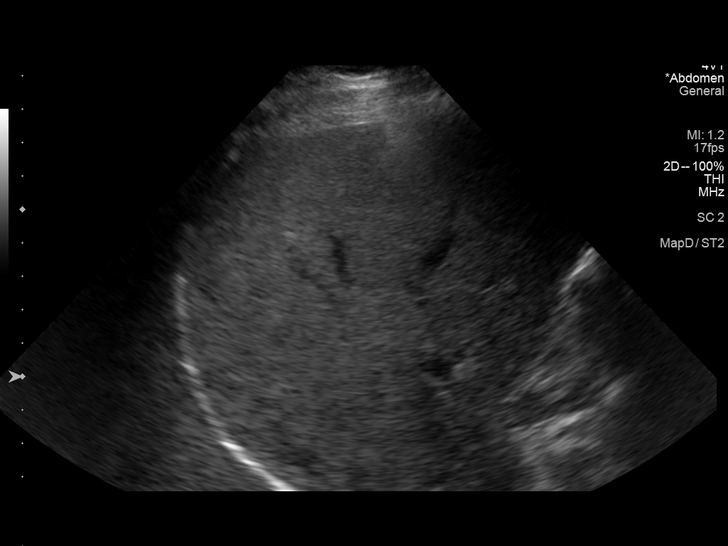

[14 of 25 positions shown; findings below may reference images not displayed]

FINDINGS: Gallbladder:

No gallstones or wall thickening visualized. There is no
pericholecystic fluid. No sonographic Murphy sign noted by
sonographer.

Common bile duct:

Diameter: 4 mm. No intrahepatic or extrahepatic biliary duct
dilatation.

Liver:

No focal lesion identified. Within normal limits in parenchymal
echogenicity. Portal vein is patent on color Doppler imaging with
normal direction of blood flow towards the liver.

Other: None.
IMPRESSION: Study within normal limits.

## 2021-06-20 DIAGNOSIS — Z85850 Personal history of malignant neoplasm of thyroid: Secondary | ICD-10-CM | POA: Diagnosis not present

## 2021-06-20 DIAGNOSIS — E7800 Pure hypercholesterolemia, unspecified: Secondary | ICD-10-CM | POA: Diagnosis not present

## 2021-06-20 DIAGNOSIS — R1010 Upper abdominal pain, unspecified: Secondary | ICD-10-CM | POA: Diagnosis not present

## 2021-06-20 DIAGNOSIS — E039 Hypothyroidism, unspecified: Secondary | ICD-10-CM | POA: Diagnosis not present

## 2021-06-20 DIAGNOSIS — I10 Essential (primary) hypertension: Secondary | ICD-10-CM | POA: Diagnosis not present

## 2021-06-20 DIAGNOSIS — Q438 Other specified congenital malformations of intestine: Secondary | ICD-10-CM | POA: Diagnosis not present

## 2021-06-20 DIAGNOSIS — E669 Obesity, unspecified: Secondary | ICD-10-CM | POA: Diagnosis not present

## 2021-06-20 DIAGNOSIS — K2100 Gastro-esophageal reflux disease with esophagitis, without bleeding: Secondary | ICD-10-CM | POA: Diagnosis not present

## 2021-06-20 DIAGNOSIS — K2970 Gastritis, unspecified, without bleeding: Secondary | ICD-10-CM | POA: Diagnosis not present

## 2021-07-15 DIAGNOSIS — Z1231 Encounter for screening mammogram for malignant neoplasm of breast: Secondary | ICD-10-CM | POA: Diagnosis not present

## 2021-08-01 DIAGNOSIS — K588 Other irritable bowel syndrome: Secondary | ICD-10-CM | POA: Diagnosis not present

## 2021-08-01 DIAGNOSIS — R1013 Epigastric pain: Secondary | ICD-10-CM | POA: Diagnosis not present

## 2021-08-16 DIAGNOSIS — R1084 Generalized abdominal pain: Secondary | ICD-10-CM | POA: Diagnosis not present

## 2021-08-16 DIAGNOSIS — K588 Other irritable bowel syndrome: Secondary | ICD-10-CM | POA: Diagnosis not present

## 2021-08-19 DIAGNOSIS — K588 Other irritable bowel syndrome: Secondary | ICD-10-CM | POA: Diagnosis not present

## 2021-08-19 DIAGNOSIS — K760 Fatty (change of) liver, not elsewhere classified: Secondary | ICD-10-CM | POA: Diagnosis not present

## 2021-08-19 DIAGNOSIS — R1084 Generalized abdominal pain: Secondary | ICD-10-CM | POA: Diagnosis not present

## 2021-10-07 DIAGNOSIS — Z79899 Other long term (current) drug therapy: Secondary | ICD-10-CM | POA: Diagnosis not present

## 2021-10-07 DIAGNOSIS — Z6831 Body mass index (BMI) 31.0-31.9, adult: Secondary | ICD-10-CM | POA: Diagnosis not present

## 2021-10-07 DIAGNOSIS — E890 Postprocedural hypothyroidism: Secondary | ICD-10-CM | POA: Diagnosis not present

## 2021-10-07 DIAGNOSIS — Z85850 Personal history of malignant neoplasm of thyroid: Secondary | ICD-10-CM | POA: Diagnosis not present

## 2021-10-08 DIAGNOSIS — K588 Other irritable bowel syndrome: Secondary | ICD-10-CM | POA: Diagnosis not present

## 2021-10-30 DIAGNOSIS — I10 Essential (primary) hypertension: Secondary | ICD-10-CM | POA: Diagnosis not present

## 2021-10-30 DIAGNOSIS — E782 Mixed hyperlipidemia: Secondary | ICD-10-CM | POA: Diagnosis not present

## 2021-10-30 DIAGNOSIS — E890 Postprocedural hypothyroidism: Secondary | ICD-10-CM | POA: Diagnosis not present

## 2021-10-30 DIAGNOSIS — K219 Gastro-esophageal reflux disease without esophagitis: Secondary | ICD-10-CM | POA: Diagnosis not present

## 2021-12-27 DIAGNOSIS — Z0000 Encounter for general adult medical examination without abnormal findings: Secondary | ICD-10-CM | POA: Diagnosis not present

## 2021-12-27 DIAGNOSIS — Z1231 Encounter for screening mammogram for malignant neoplasm of breast: Secondary | ICD-10-CM | POA: Diagnosis not present

## 2022-01-03 DIAGNOSIS — R1010 Upper abdominal pain, unspecified: Secondary | ICD-10-CM | POA: Diagnosis not present

## 2022-03-06 DIAGNOSIS — H65112 Acute and subacute allergic otitis media (mucoid) (sanguinous) (serous), left ear: Secondary | ICD-10-CM | POA: Diagnosis not present

## 2022-03-06 DIAGNOSIS — H9202 Otalgia, left ear: Secondary | ICD-10-CM | POA: Diagnosis not present

## 2022-04-01 DIAGNOSIS — M7521 Bicipital tendinitis, right shoulder: Secondary | ICD-10-CM | POA: Diagnosis not present

## 2022-06-30 DIAGNOSIS — K219 Gastro-esophageal reflux disease without esophagitis: Secondary | ICD-10-CM | POA: Diagnosis not present

## 2022-06-30 DIAGNOSIS — I10 Essential (primary) hypertension: Secondary | ICD-10-CM | POA: Diagnosis not present

## 2022-06-30 DIAGNOSIS — E890 Postprocedural hypothyroidism: Secondary | ICD-10-CM | POA: Diagnosis not present

## 2022-06-30 DIAGNOSIS — K581 Irritable bowel syndrome with constipation: Secondary | ICD-10-CM | POA: Diagnosis not present

## 2022-06-30 DIAGNOSIS — E782 Mixed hyperlipidemia: Secondary | ICD-10-CM | POA: Diagnosis not present

## 2022-07-07 DIAGNOSIS — K5900 Constipation, unspecified: Secondary | ICD-10-CM | POA: Diagnosis not present

## 2022-07-07 DIAGNOSIS — R140 Abdominal distension (gaseous): Secondary | ICD-10-CM | POA: Diagnosis not present

## 2022-07-26 ENCOUNTER — Other Ambulatory Visit (HOSPITAL_COMMUNITY): Payer: Self-pay

## 2022-08-20 DIAGNOSIS — I10 Essential (primary) hypertension: Secondary | ICD-10-CM | POA: Diagnosis not present

## 2022-08-20 DIAGNOSIS — R140 Abdominal distension (gaseous): Secondary | ICD-10-CM | POA: Diagnosis not present

## 2022-08-20 DIAGNOSIS — K5909 Other constipation: Secondary | ICD-10-CM | POA: Diagnosis not present

## 2022-08-20 DIAGNOSIS — R1084 Generalized abdominal pain: Secondary | ICD-10-CM | POA: Diagnosis not present

## 2022-08-21 DIAGNOSIS — K5900 Constipation, unspecified: Secondary | ICD-10-CM | POA: Diagnosis not present

## 2022-08-26 DIAGNOSIS — N8003 Adenomyosis of the uterus: Secondary | ICD-10-CM | POA: Diagnosis not present

## 2022-08-26 DIAGNOSIS — D259 Leiomyoma of uterus, unspecified: Secondary | ICD-10-CM | POA: Diagnosis not present

## 2022-08-26 DIAGNOSIS — R140 Abdominal distension (gaseous): Secondary | ICD-10-CM | POA: Diagnosis not present

## 2022-10-16 DIAGNOSIS — Z1231 Encounter for screening mammogram for malignant neoplasm of breast: Secondary | ICD-10-CM | POA: Diagnosis not present

## 2022-10-30 DIAGNOSIS — Z6833 Body mass index (BMI) 33.0-33.9, adult: Secondary | ICD-10-CM | POA: Diagnosis not present

## 2022-10-30 DIAGNOSIS — E890 Postprocedural hypothyroidism: Secondary | ICD-10-CM | POA: Diagnosis not present

## 2022-10-30 DIAGNOSIS — Z79899 Other long term (current) drug therapy: Secondary | ICD-10-CM | POA: Diagnosis not present

## 2022-10-30 DIAGNOSIS — Z9089 Acquired absence of other organs: Secondary | ICD-10-CM | POA: Diagnosis not present

## 2022-10-30 DIAGNOSIS — Z85850 Personal history of malignant neoplasm of thyroid: Secondary | ICD-10-CM | POA: Diagnosis not present

## 2022-11-03 DIAGNOSIS — R1013 Epigastric pain: Secondary | ICD-10-CM | POA: Diagnosis not present

## 2022-11-03 DIAGNOSIS — K5900 Constipation, unspecified: Secondary | ICD-10-CM | POA: Diagnosis not present

## 2022-12-01 DIAGNOSIS — K5900 Constipation, unspecified: Secondary | ICD-10-CM | POA: Diagnosis not present

## 2022-12-01 DIAGNOSIS — R1013 Epigastric pain: Secondary | ICD-10-CM | POA: Diagnosis not present

## 2022-12-01 DIAGNOSIS — K760 Fatty (change of) liver, not elsewhere classified: Secondary | ICD-10-CM | POA: Diagnosis not present

## 2022-12-03 DIAGNOSIS — R140 Abdominal distension (gaseous): Secondary | ICD-10-CM | POA: Diagnosis not present

## 2022-12-03 DIAGNOSIS — K5909 Other constipation: Secondary | ICD-10-CM | POA: Diagnosis not present

## 2022-12-03 DIAGNOSIS — R1084 Generalized abdominal pain: Secondary | ICD-10-CM | POA: Diagnosis not present

## 2022-12-29 DIAGNOSIS — K219 Gastro-esophageal reflux disease without esophagitis: Secondary | ICD-10-CM | POA: Diagnosis not present

## 2022-12-29 DIAGNOSIS — R1010 Upper abdominal pain, unspecified: Secondary | ICD-10-CM | POA: Diagnosis not present

## 2022-12-29 DIAGNOSIS — I10 Essential (primary) hypertension: Secondary | ICD-10-CM | POA: Diagnosis not present

## 2022-12-31 DIAGNOSIS — M62838 Other muscle spasm: Secondary | ICD-10-CM | POA: Diagnosis not present

## 2022-12-31 DIAGNOSIS — R278 Other lack of coordination: Secondary | ICD-10-CM | POA: Diagnosis not present

## 2022-12-31 DIAGNOSIS — R1010 Upper abdominal pain, unspecified: Secondary | ICD-10-CM | POA: Diagnosis not present

## 2023-01-02 DIAGNOSIS — Z0000 Encounter for general adult medical examination without abnormal findings: Secondary | ICD-10-CM | POA: Diagnosis not present

## 2023-02-03 DIAGNOSIS — R1010 Upper abdominal pain, unspecified: Secondary | ICD-10-CM | POA: Diagnosis not present

## 2023-02-03 DIAGNOSIS — R278 Other lack of coordination: Secondary | ICD-10-CM | POA: Diagnosis not present

## 2023-02-03 DIAGNOSIS — M62838 Other muscle spasm: Secondary | ICD-10-CM | POA: Diagnosis not present

## 2023-02-05 DIAGNOSIS — M62838 Other muscle spasm: Secondary | ICD-10-CM | POA: Diagnosis not present

## 2023-02-05 DIAGNOSIS — R278 Other lack of coordination: Secondary | ICD-10-CM | POA: Diagnosis not present

## 2023-02-05 DIAGNOSIS — R1010 Upper abdominal pain, unspecified: Secondary | ICD-10-CM | POA: Diagnosis not present

## 2023-02-12 DIAGNOSIS — R1010 Upper abdominal pain, unspecified: Secondary | ICD-10-CM | POA: Diagnosis not present

## 2023-02-12 DIAGNOSIS — M62838 Other muscle spasm: Secondary | ICD-10-CM | POA: Diagnosis not present

## 2023-02-12 DIAGNOSIS — R278 Other lack of coordination: Secondary | ICD-10-CM | POA: Diagnosis not present

## 2023-02-17 DIAGNOSIS — M62838 Other muscle spasm: Secondary | ICD-10-CM | POA: Diagnosis not present

## 2023-02-17 DIAGNOSIS — R278 Other lack of coordination: Secondary | ICD-10-CM | POA: Diagnosis not present

## 2023-02-17 DIAGNOSIS — R1010 Upper abdominal pain, unspecified: Secondary | ICD-10-CM | POA: Diagnosis not present

## 2023-02-19 DIAGNOSIS — R1010 Upper abdominal pain, unspecified: Secondary | ICD-10-CM | POA: Diagnosis not present

## 2023-02-19 DIAGNOSIS — M62838 Other muscle spasm: Secondary | ICD-10-CM | POA: Diagnosis not present

## 2023-02-19 DIAGNOSIS — R278 Other lack of coordination: Secondary | ICD-10-CM | POA: Diagnosis not present

## 2023-02-23 DIAGNOSIS — R1010 Upper abdominal pain, unspecified: Secondary | ICD-10-CM | POA: Diagnosis not present

## 2023-02-23 DIAGNOSIS — M62838 Other muscle spasm: Secondary | ICD-10-CM | POA: Diagnosis not present

## 2023-02-23 DIAGNOSIS — R278 Other lack of coordination: Secondary | ICD-10-CM | POA: Diagnosis not present

## 2023-02-26 DIAGNOSIS — R1010 Upper abdominal pain, unspecified: Secondary | ICD-10-CM | POA: Diagnosis not present

## 2023-02-26 DIAGNOSIS — M62838 Other muscle spasm: Secondary | ICD-10-CM | POA: Diagnosis not present

## 2023-02-26 DIAGNOSIS — R278 Other lack of coordination: Secondary | ICD-10-CM | POA: Diagnosis not present

## 2023-03-10 DIAGNOSIS — R1010 Upper abdominal pain, unspecified: Secondary | ICD-10-CM | POA: Diagnosis not present

## 2023-03-10 DIAGNOSIS — M62838 Other muscle spasm: Secondary | ICD-10-CM | POA: Diagnosis not present

## 2023-03-10 DIAGNOSIS — R278 Other lack of coordination: Secondary | ICD-10-CM | POA: Diagnosis not present

## 2023-03-12 DIAGNOSIS — R278 Other lack of coordination: Secondary | ICD-10-CM | POA: Diagnosis not present

## 2023-03-12 DIAGNOSIS — M62838 Other muscle spasm: Secondary | ICD-10-CM | POA: Diagnosis not present

## 2023-03-12 DIAGNOSIS — R1010 Upper abdominal pain, unspecified: Secondary | ICD-10-CM | POA: Diagnosis not present

## 2023-03-19 DIAGNOSIS — R1010 Upper abdominal pain, unspecified: Secondary | ICD-10-CM | POA: Diagnosis not present

## 2023-03-19 DIAGNOSIS — R278 Other lack of coordination: Secondary | ICD-10-CM | POA: Diagnosis not present

## 2023-03-19 DIAGNOSIS — M62838 Other muscle spasm: Secondary | ICD-10-CM | POA: Diagnosis not present

## 2023-03-26 DIAGNOSIS — R278 Other lack of coordination: Secondary | ICD-10-CM | POA: Diagnosis not present

## 2023-03-26 DIAGNOSIS — M62838 Other muscle spasm: Secondary | ICD-10-CM | POA: Diagnosis not present

## 2023-03-26 DIAGNOSIS — R1010 Upper abdominal pain, unspecified: Secondary | ICD-10-CM | POA: Diagnosis not present

## 2023-05-05 DIAGNOSIS — R1010 Upper abdominal pain, unspecified: Secondary | ICD-10-CM | POA: Diagnosis not present

## 2023-05-05 DIAGNOSIS — M62838 Other muscle spasm: Secondary | ICD-10-CM | POA: Diagnosis not present

## 2023-05-05 DIAGNOSIS — R278 Other lack of coordination: Secondary | ICD-10-CM | POA: Diagnosis not present

## 2023-05-19 DIAGNOSIS — M62838 Other muscle spasm: Secondary | ICD-10-CM | POA: Diagnosis not present

## 2023-05-19 DIAGNOSIS — R1010 Upper abdominal pain, unspecified: Secondary | ICD-10-CM | POA: Diagnosis not present

## 2023-05-19 DIAGNOSIS — R278 Other lack of coordination: Secondary | ICD-10-CM | POA: Diagnosis not present

## 2023-06-01 DIAGNOSIS — R1010 Upper abdominal pain, unspecified: Secondary | ICD-10-CM | POA: Diagnosis not present

## 2023-06-01 DIAGNOSIS — M62838 Other muscle spasm: Secondary | ICD-10-CM | POA: Diagnosis not present

## 2023-06-01 DIAGNOSIS — R278 Other lack of coordination: Secondary | ICD-10-CM | POA: Diagnosis not present

## 2023-06-25 DIAGNOSIS — L814 Other melanin hyperpigmentation: Secondary | ICD-10-CM | POA: Diagnosis not present

## 2023-06-25 DIAGNOSIS — Z7189 Other specified counseling: Secondary | ICD-10-CM | POA: Diagnosis not present

## 2023-06-25 DIAGNOSIS — D225 Melanocytic nevi of trunk: Secondary | ICD-10-CM | POA: Diagnosis not present

## 2023-06-25 DIAGNOSIS — L821 Other seborrheic keratosis: Secondary | ICD-10-CM | POA: Diagnosis not present

## 2023-06-30 DIAGNOSIS — M62838 Other muscle spasm: Secondary | ICD-10-CM | POA: Diagnosis not present

## 2023-06-30 DIAGNOSIS — R278 Other lack of coordination: Secondary | ICD-10-CM | POA: Diagnosis not present

## 2023-06-30 DIAGNOSIS — R1010 Upper abdominal pain, unspecified: Secondary | ICD-10-CM | POA: Diagnosis not present

## 2023-10-29 DIAGNOSIS — Z85850 Personal history of malignant neoplasm of thyroid: Secondary | ICD-10-CM | POA: Diagnosis not present

## 2023-10-29 DIAGNOSIS — Z6832 Body mass index (BMI) 32.0-32.9, adult: Secondary | ICD-10-CM | POA: Diagnosis not present

## 2023-10-29 DIAGNOSIS — R599 Enlarged lymph nodes, unspecified: Secondary | ICD-10-CM | POA: Diagnosis not present

## 2023-10-29 DIAGNOSIS — Z79899 Other long term (current) drug therapy: Secondary | ICD-10-CM | POA: Diagnosis not present

## 2023-10-29 DIAGNOSIS — E890 Postprocedural hypothyroidism: Secondary | ICD-10-CM | POA: Diagnosis not present

## 2023-12-14 DIAGNOSIS — M47817 Spondylosis without myelopathy or radiculopathy, lumbosacral region: Secondary | ICD-10-CM | POA: Diagnosis not present

## 2023-12-14 DIAGNOSIS — G8929 Other chronic pain: Secondary | ICD-10-CM | POA: Diagnosis not present

## 2023-12-14 DIAGNOSIS — M546 Pain in thoracic spine: Secondary | ICD-10-CM | POA: Diagnosis not present

## 2023-12-14 DIAGNOSIS — M4319 Spondylolisthesis, multiple sites in spine: Secondary | ICD-10-CM | POA: Diagnosis not present

## 2023-12-14 DIAGNOSIS — M47816 Spondylosis without myelopathy or radiculopathy, lumbar region: Secondary | ICD-10-CM | POA: Diagnosis not present

## 2023-12-14 DIAGNOSIS — M4316 Spondylolisthesis, lumbar region: Secondary | ICD-10-CM | POA: Diagnosis not present

## 2023-12-14 DIAGNOSIS — M4317 Spondylolisthesis, lumbosacral region: Secondary | ICD-10-CM | POA: Diagnosis not present

## 2023-12-14 DIAGNOSIS — R1084 Generalized abdominal pain: Secondary | ICD-10-CM | POA: Diagnosis not present

## 2023-12-14 DIAGNOSIS — M2578 Osteophyte, vertebrae: Secondary | ICD-10-CM | POA: Diagnosis not present

## 2023-12-19 DIAGNOSIS — R222 Localized swelling, mass and lump, trunk: Secondary | ICD-10-CM | POA: Diagnosis not present

## 2023-12-19 DIAGNOSIS — R1900 Intra-abdominal and pelvic swelling, mass and lump, unspecified site: Secondary | ICD-10-CM | POA: Diagnosis not present

## 2023-12-23 DIAGNOSIS — R1010 Upper abdominal pain, unspecified: Secondary | ICD-10-CM | POA: Diagnosis not present

## 2023-12-23 DIAGNOSIS — N289 Disorder of kidney and ureter, unspecified: Secondary | ICD-10-CM | POA: Diagnosis not present

## 2023-12-23 DIAGNOSIS — K581 Irritable bowel syndrome with constipation: Secondary | ICD-10-CM | POA: Diagnosis not present

## 2024-01-04 DIAGNOSIS — Z0000 Encounter for general adult medical examination without abnormal findings: Secondary | ICD-10-CM | POA: Diagnosis not present

## 2024-01-04 DIAGNOSIS — Z1231 Encounter for screening mammogram for malignant neoplasm of breast: Secondary | ICD-10-CM | POA: Diagnosis not present

## 2024-01-13 DIAGNOSIS — M5416 Radiculopathy, lumbar region: Secondary | ICD-10-CM | POA: Diagnosis not present

## 2024-01-15 DIAGNOSIS — M545 Low back pain, unspecified: Secondary | ICD-10-CM | POA: Diagnosis not present

## 2024-01-19 DIAGNOSIS — M47816 Spondylosis without myelopathy or radiculopathy, lumbar region: Secondary | ICD-10-CM | POA: Diagnosis not present

## 2024-01-19 DIAGNOSIS — M1612 Unilateral primary osteoarthritis, left hip: Secondary | ICD-10-CM | POA: Diagnosis not present

## 2024-01-19 DIAGNOSIS — M479 Spondylosis, unspecified: Secondary | ICD-10-CM | POA: Diagnosis not present

## 2024-01-21 DIAGNOSIS — M25652 Stiffness of left hip, not elsewhere classified: Secondary | ICD-10-CM | POA: Diagnosis not present

## 2024-01-21 DIAGNOSIS — M25552 Pain in left hip: Secondary | ICD-10-CM | POA: Diagnosis not present

## 2024-01-21 DIAGNOSIS — R2689 Other abnormalities of gait and mobility: Secondary | ICD-10-CM | POA: Diagnosis not present

## 2024-01-22 DIAGNOSIS — M1612 Unilateral primary osteoarthritis, left hip: Secondary | ICD-10-CM | POA: Diagnosis not present

## 2024-01-22 DIAGNOSIS — M25552 Pain in left hip: Secondary | ICD-10-CM | POA: Diagnosis not present

## 2024-01-26 DIAGNOSIS — M1612 Unilateral primary osteoarthritis, left hip: Secondary | ICD-10-CM | POA: Diagnosis not present

## 2024-01-27 DIAGNOSIS — M25552 Pain in left hip: Secondary | ICD-10-CM | POA: Diagnosis not present

## 2024-01-27 DIAGNOSIS — R2689 Other abnormalities of gait and mobility: Secondary | ICD-10-CM | POA: Diagnosis not present

## 2024-01-27 DIAGNOSIS — M25652 Stiffness of left hip, not elsewhere classified: Secondary | ICD-10-CM | POA: Diagnosis not present

## 2024-02-01 DIAGNOSIS — M25552 Pain in left hip: Secondary | ICD-10-CM | POA: Diagnosis not present

## 2024-02-01 DIAGNOSIS — R2689 Other abnormalities of gait and mobility: Secondary | ICD-10-CM | POA: Diagnosis not present

## 2024-02-01 DIAGNOSIS — M25652 Stiffness of left hip, not elsewhere classified: Secondary | ICD-10-CM | POA: Diagnosis not present

## 2024-02-03 DIAGNOSIS — R2689 Other abnormalities of gait and mobility: Secondary | ICD-10-CM | POA: Diagnosis not present

## 2024-02-03 DIAGNOSIS — M25552 Pain in left hip: Secondary | ICD-10-CM | POA: Diagnosis not present

## 2024-02-03 DIAGNOSIS — M25652 Stiffness of left hip, not elsewhere classified: Secondary | ICD-10-CM | POA: Diagnosis not present

## 2024-02-08 DIAGNOSIS — M25552 Pain in left hip: Secondary | ICD-10-CM | POA: Diagnosis not present

## 2024-02-08 DIAGNOSIS — M25652 Stiffness of left hip, not elsewhere classified: Secondary | ICD-10-CM | POA: Diagnosis not present

## 2024-02-08 DIAGNOSIS — R2689 Other abnormalities of gait and mobility: Secondary | ICD-10-CM | POA: Diagnosis not present

## 2024-02-17 DIAGNOSIS — R2689 Other abnormalities of gait and mobility: Secondary | ICD-10-CM | POA: Diagnosis not present

## 2024-02-17 DIAGNOSIS — M25552 Pain in left hip: Secondary | ICD-10-CM | POA: Diagnosis not present

## 2024-02-17 DIAGNOSIS — M25652 Stiffness of left hip, not elsewhere classified: Secondary | ICD-10-CM | POA: Diagnosis not present

## 2024-02-24 DIAGNOSIS — L089 Local infection of the skin and subcutaneous tissue, unspecified: Secondary | ICD-10-CM | POA: Diagnosis not present

## 2024-02-24 DIAGNOSIS — W548XXA Other contact with dog, initial encounter: Secondary | ICD-10-CM | POA: Diagnosis not present

## 2024-03-15 DIAGNOSIS — M1612 Unilateral primary osteoarthritis, left hip: Secondary | ICD-10-CM | POA: Diagnosis not present

## 2024-03-23 DIAGNOSIS — M1612 Unilateral primary osteoarthritis, left hip: Secondary | ICD-10-CM | POA: Diagnosis not present

## 2024-04-02 DIAGNOSIS — G8918 Other acute postprocedural pain: Secondary | ICD-10-CM | POA: Diagnosis not present

## 2024-04-02 DIAGNOSIS — Z96642 Presence of left artificial hip joint: Secondary | ICD-10-CM | POA: Diagnosis not present

## 2024-04-02 DIAGNOSIS — M25552 Pain in left hip: Secondary | ICD-10-CM | POA: Diagnosis not present

## 2024-05-26 ENCOUNTER — Other Ambulatory Visit (HOSPITAL_BASED_OUTPATIENT_CLINIC_OR_DEPARTMENT_OTHER): Payer: Self-pay

## 2024-05-26 ENCOUNTER — Other Ambulatory Visit (HOSPITAL_COMMUNITY): Payer: Self-pay

## 2024-05-27 ENCOUNTER — Other Ambulatory Visit: Payer: Self-pay

## 2024-05-27 ENCOUNTER — Other Ambulatory Visit (HOSPITAL_COMMUNITY): Payer: Self-pay

## 2024-05-27 MED ORDER — METOPROLOL SUCCINATE ER 25 MG PO TB24
25.0000 mg | ORAL_TABLET | Freq: Every day | ORAL | 3 refills | Status: AC
  Filled 2024-05-27: qty 90, 90d supply, fill #0

## 2024-05-30 ENCOUNTER — Other Ambulatory Visit: Payer: Self-pay

## 2024-06-05 DIAGNOSIS — L089 Local infection of the skin and subcutaneous tissue, unspecified: Secondary | ICD-10-CM | POA: Diagnosis not present
